# Patient Record
Sex: Male | Born: 1990 | Hispanic: Yes | Marital: Single | Smoking: Former smoker
Health system: Southern US, Community
[De-identification: ages and names within clinical notes are randomized; demographics above are authoritative.]

## PROBLEM LIST (undated history)

## (undated) DIAGNOSIS — J939 Pneumothorax, unspecified: Secondary | ICD-10-CM

## (undated) DIAGNOSIS — J45909 Unspecified asthma, uncomplicated: Secondary | ICD-10-CM

## (undated) HISTORY — DX: Pneumothorax, unspecified: J93.9

---
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
## 2012-02-13 ENCOUNTER — Emergency Department: Payer: Self-pay | Admitting: Emergency Medicine

## 2015-01-06 ENCOUNTER — Encounter: Payer: Self-pay | Admitting: *Deleted

## 2015-01-06 ENCOUNTER — Emergency Department: Payer: Self-pay

## 2015-01-06 ENCOUNTER — Observation Stay
Admission: EM | Admit: 2015-01-06 | Discharge: 2015-01-07 | Disposition: A | Discharge status: Home / Self Care | Payer: Self-pay | Attending: Specialist | Admitting: Specialist

## 2015-01-06 DIAGNOSIS — R062 Wheezing: Secondary | ICD-10-CM

## 2015-01-06 DIAGNOSIS — R079 Chest pain, unspecified: Secondary | ICD-10-CM

## 2015-01-06 DIAGNOSIS — R0789 Other chest pain: Secondary | ICD-10-CM | POA: Insufficient documentation

## 2015-01-06 DIAGNOSIS — J982 Interstitial emphysema: Principal | ICD-10-CM | POA: Diagnosis present

## 2015-01-06 DIAGNOSIS — J45901 Unspecified asthma with (acute) exacerbation: Secondary | ICD-10-CM | POA: Insufficient documentation

## 2015-01-06 DIAGNOSIS — F17210 Nicotine dependence, cigarettes, uncomplicated: Secondary | ICD-10-CM | POA: Insufficient documentation

## 2015-01-06 HISTORY — DX: Unspecified asthma, uncomplicated: J45.909

## 2015-01-06 LAB — COMPREHENSIVE METABOLIC PANEL
ALT: 12 U/L — ABNORMAL LOW (ref 17–63)
AST: 19 U/L (ref 15–41)
Albumin: 4.7 g/dL (ref 3.5–5.0)
Alkaline Phosphatase: 62 U/L (ref 38–126)
Anion gap: 11 (ref 5–15)
BILIRUBIN TOTAL: 0.7 mg/dL (ref 0.3–1.2)
BUN: 11 mg/dL (ref 6–20)
CHLORIDE: 105 mmol/L (ref 101–111)
CO2: 24 mmol/L (ref 22–32)
Calcium: 9.5 mg/dL (ref 8.9–10.3)
Creatinine, Ser: 1.01 mg/dL (ref 0.61–1.24)
GFR calc Af Amer: >60 mL/min (ref >60)
Glucose, Bld: 121 mg/dL — ABNORMAL HIGH (ref 65–99)
POTASSIUM: 3.5 mmol/L (ref 3.5–5.1)
Sodium: 140 mmol/L (ref 135–145)
Total Protein: 7.4 g/dL (ref 6.5–8.1)

## 2015-01-06 LAB — CBC WITH DIFFERENTIAL/PLATELET
Basophils Absolute: 0 10*3/uL (ref 0–0.1)
Basophils Relative: 0 %
EOS PCT: 1 %
Eosinophils Absolute: 0.1 10*3/uL (ref 0–0.7)
HCT: 48.6 % (ref 40.0–52.0)
Hemoglobin: 16.4 g/dL (ref 13.0–18.0)
LYMPHS ABS: 0.8 10*3/uL — AB (ref 1.0–3.6)
Lymphocytes Relative: 5 %
MCH: 31.1 pg (ref 26.0–34.0)
MCHC: 33.8 g/dL (ref 32.0–36.0)
MCV: 92 fL (ref 80.0–100.0)
Monocytes Absolute: 1.1 10*3/uL — ABNORMAL HIGH (ref 0.2–1.0)
Monocytes Relative: 7 %
NEUTROS ABS: 13.2 10*3/uL — AB (ref 1.4–6.5)
NEUTROS PCT: 87 %
PLATELETS: 260 10*3/uL (ref 150–440)
RBC: 5.28 MIL/uL (ref 4.40–5.90)
RDW: 13.3 % (ref 11.5–14.5)
WBC: 15.2 10*3/uL — ABNORMAL HIGH (ref 3.8–10.6)

## 2015-01-06 LAB — TROPONIN I

## 2015-01-06 LAB — POCT RAPID STREP A: STREPTOCOCCUS, GROUP A SCREEN (DIRECT): NEGATIVE

## 2015-01-06 MED ORDER — IPRATROPIUM-ALBUTEROL 0.5-2.5 (3) MG/3ML IN SOLN
3.0000 mL | Freq: Once | RESPIRATORY_TRACT | Status: AC
  Administered 2015-01-06: 3 mL via RESPIRATORY_TRACT

## 2015-01-06 MED ORDER — SODIUM CHLORIDE 0.9 % IV SOLN
Freq: Once | INTRAVENOUS | Status: AC
  Administered 2015-01-06: 22:00:00 via INTRAVENOUS

## 2015-01-06 MED ORDER — IPRATROPIUM-ALBUTEROL 0.5-2.5 (3) MG/3ML IN SOLN
RESPIRATORY_TRACT | Status: AC
  Administered 2015-01-06: 3 mL via RESPIRATORY_TRACT
  Filled 2015-01-06: qty 6

## 2015-01-06 MED ORDER — PREDNISONE 20 MG PO TABS
ORAL_TABLET | ORAL | Status: AC
  Administered 2015-01-06: 60 mg via ORAL
  Filled 2015-01-06: qty 3

## 2015-01-06 MED ORDER — IOHEXOL 300 MG/ML  SOLN
75.0000 mL | Freq: Once | INTRAMUSCULAR | Status: AC | PRN
  Administered 2015-01-06: 75 mL via INTRAVENOUS
  Filled 2015-01-06: qty 75

## 2015-01-06 MED ORDER — PREDNISONE 20 MG PO TABS
60.0000 mg | ORAL_TABLET | Freq: Once | ORAL | Status: AC
  Administered 2015-01-06: 60 mg via ORAL

## 2015-01-06 NOTE — ED Notes (Signed)
Sorethroat, chest pain,,tightness, cold symptoms

## 2015-01-06 NOTE — ED Notes (Signed)
Pt to ED from home c/o runny nose, cough, sore throat, and chest pain starting yesterday.  Pt states pain to generalized chest radiating to throat and lymph nodes and back.  States increased pain with movement and deep breaths.  Pt tender to palpation across chest.  Positive inspiratory wheezes heard to upper and lower left lung and lower right lung.  Pt states hx of asthma as a child "but I have not used my inhaler in 7 years".  Pt states cough with productive green sputum at home.  Pt A&Ox4, speaking in complete and coherent sentences and in NAD at this time.

## 2015-01-06 NOTE — ED Notes (Signed)
Patient transported to X-ray 

## 2015-01-06 NOTE — ED Provider Notes (Signed)
[facility] Emergency Department Provider Note  ____________________________________________  Time seen: Approximately 8:02 PM  I have reviewed the triage vital signs and the nursing notes.   HISTORY  Chief Complaint Cough   HPI Eddie Meadows is a 24 y.o. male presents the ER with family at bedside for the complaint of few days of runny nose, cough and congestion with sore throat. Patient states that yesterday he began to wheeze and cough more. Patient states that when he is coughing and wheezing he has chest discomfort that is described as chest tightness. Patient states the chest tightness is primarily with coughing and wheezing. States feels like he can not take a deep breath but denies shortness of breath. States intermittent mid chest pain that radiates to mid back. States movement does not change pain. Denies fever. Denies fall or trauma.   Reports continues to eat and drink well, but states that it hurts to swallow. Patient states that sore throat present 1 day. Patient states that cough increases at night as well as congestion increases at night. Patient states intermittently coughs up "green sputum".  Patient reports history of similar chest tightness with his asthma as a child but states this is different. Patient states that his asthma has been well controlled for the last few years.  Denies shortness of breath, abdominal pain, fever, neck or back pain, decreased by mouth intake, fall or injury. Denies others recently with sickness around him. Denies chest tightness unless wheezing or coughing.   Past Medical History  Diagnosis Date  . Asthma     Patient Active Problem List   Diagnosis Date Noted  . Pneumomediastinum 01/07/2015    History reviewed. No pertinent past surgical history.  No current outpatient prescriptions on file.  Allergies Penicillins  Family History  Problem Relation Age of Onset  . Diabetes Neg Hx     Social  History History  Substance Use Topics  . Smoking status: Current Every Day Smoker  . Smokeless tobacco: Not on file  . Alcohol Use: Yes    Review of Systems Constitutional: No fever/chills Eyes: No visual changes. ENT: positive for congestion and sore throat. Cardiovascular: Denies chest pain at this time. States intermittent chest tightness with wheezing and cough.  Respiratory: Denies shortness of breath. Positive for cough.  Gastrointestinal: No abdominal pain.  No nausea, no vomiting.  No diarrhea.  No constipation. Genitourinary: Negative for dysuria. Musculoskeletal: Negative for back pain. Skin: Negative for rash. Neurological: Negative for headaches, focal weakness or numbness.  10-point ROS otherwise negative.  ____________________________________________   PHYSICAL EXAM:  VITAL SIGNS: ED Triage Vitals  Enc Vitals Group     BP 01/06/15 1856 107/68 mmHg     Pulse Rate 01/06/15 1856 71     Resp 01/06/15 1856 20     Temp 01/06/15 1856 98.6 F (37 C)     Temp Source 01/06/15 1856 Oral     SpO2 01/06/15 1856 98 %     Weight 01/06/15 1856 160 lb (72.576 kg)     Height 01/06/15 1856 5\' 7"  (1.702 m)     Head Cir --      Peak Flow --      Pain Score 01/06/15 1857 8     Pain Loc --      Pain Edu? --      Excl. in GC? --     Constitutional: Alert and oriented. Well appearing and in no acute distress. Eyes: Conjunctivae are normal. PERRL. EOMI.  Head: Atraumatic. Ears: no erythema, normal TMs.  Nose: mild clear rhinorrhea Mouth/Throat: Mucous membranes are moist. Pharynx mild erythema. No tonsillar swelling. No uvular deviation or shift. Clearing secretions well. Neck: No stridor.  No cervical spine tenderness to palpation. Anterior neck nontender. Hematological/Lymphatic/Immunilogical: No cervical lymphadenopathy. Cardiovascular: Normal rate, regular rhythm. Grossly normal heart sounds.  Good peripheral circulation. Respiratory: Normal respiratory effort.   Inspiratory and expiratory scattered wheezes, worse right lower lobe left upper lobe. Intermittent dry cough. Chest nontender to palpation. No crepitus palpated. No retractions. No ecchymosis.  Gastrointestinal: Soft and nontender. No distention. No abdominal bruits. No CVA tenderness. Normal bowel sounds Musculoskeletal: No lower extremity tenderness nor edema.  No joint effusions. No cervical, thoracic or lumbar tenderness to palpation changes positions quickly without discomfort or distress. Steady gait. Neurologic:  Normal speech and language. No gross focal neurologic deficits are appreciated. Speech is normal. No gait instability. Skin:  Skin is warm, dry and intact. No rash noted. Psychiatric: Mood and affect are normal. Speech and behavior are normal.   PERC negative; Well's score 0.  ____________________________________________   LABS (all labs ordered are listed, but only abnormal results are displayed)  Labs Reviewed  CBC WITH DIFFERENTIAL/PLATELET - Abnormal; Notable for the following:    WBC 15.2 (*)    Neutro Abs 13.2 (*)    Lymphs Abs 0.8 (*)    Monocytes Absolute 1.1 (*)    All other components within normal limits  COMPREHENSIVE METABOLIC PANEL - Abnormal; Notable for the following:    Glucose, Bld 121 (*)    ALT 12 (*)    All other components within normal limits  CULTURE, GROUP A STREP ([facility] ONLY)  TROPONIN I  POCT RAPID STREP A   ____________________________________________  EKG   Date: 01/06/2015  Rate: 100  Rhythm: normal sinus rhythm  QRS Axis: normal  Intervals: normal  ST/T Wave abnormalities: normal  Conduction Disutrbances: none  Narrative Interpretation: normal sinus rhythm   ____________________________________________  RADIOLOGY EXAM: CHEST 2 VIEW  COMPARISON: None.  FINDINGS: Cardiomediastinal silhouette is unremarkable. No acute infiltrate or pleural effusion. No pulmonary edema. There is small amount of subcutaneous air within  soft tissue neck. Lucent mediastinal lines are noted highly suspicious for pneumomediastinum.  IMPRESSION: No acute infiltrate or pulmonary edema. There are lucent mediastinal lines silhouetting the heart border and in upper mediastinum highly suspicious for pneumomediastinum. Clinical correlation is necessary. Further correlation with CT scan of the chest is recommended.   Electronically Signed By: Natasha Mead M.D. On: 01/06/2015 20:58     CT CHEST WITH CONTRAST  TECHNIQUE: Multidetector CT imaging of the chest was performed during intravenous contrast administration.  CONTRAST: 75mL OMNIPAQUE IOHEXOL 300 MG/ML SOLN  COMPARISON: None.  FINDINGS: THORACIC INLET/BODY WALL:  No acute abnormality.  MEDIASTINUM:  Large pneumomediastinum, dissecting into the thoracic inlet and upper chest. There is even extension into the epidural upper thoracic spinal canal. Given the clinical history, this is a presumably related to pulmonary air leak and the patient's history of asthma. No pneumothorax. No fluid collection around the esophagus.  LUNG WINDOWS:  No pneumothorax. Minor atelectasis in the left lower lobe.  UPPER ABDOMEN:  No acute findings.  OSSEOUS:  No acute fracture.  IMPRESSION: Large pneumomediastinum, as discussed above. No pneumothorax.   Electronically Signed By: Marnee Spring M.D. On: 01/07/2015 00:11   I, Renford Dills, personally viewed and evaluated these images as part of my medical decision making. I, Renford Dills, personally discussed these images and results  by phone with the on-call radiologist and used this discussion as part of my medical decision making.   ____________________________________________ _______________________________________   INITIAL IMPRESSION / ASSESSMENT AND PLAN / ED COURSE  Pertinent labs & imaging results that were available during my care of the patient were reviewed by me and considered in my  medical decision making (see chart for details).  Well-appearing patient. No acute distress. Presents to ER for complaints of few days of cough and congestion with increase of cough and wheezing the last day. Presents to ER with his current x-ray wheezing. Will evaluate chest x-ray. Albuterol nebulizing treatment.  2120: X-ray reviewed. Chest x-ray no acute infiltrate or pulmonary edema. The set mediastinal line silhouetting the heart border and upper mediastinum highly suspicious for pneumomediastinum. Discussed with Dr. Ruffin FrederickPop radiologist who recommends CT with contrast for further evaluation of pneumomediastinum.   Discussed with patient and family. Patient verbalized understanding and agreed to plan. Will evaluate labs as well as CT chest with contrast. Patient reports feeling better after nebulizing treatments. Wheezes improved. Patient reports still with some chest tightness.  2245: No acute distress. Resting calmly in bed. Awaiting CT scan. Reports chest tightness now 2/10, wheezes improved. Mild scattered intermittent wheezes. Denies chest pain.   0015: CT chest result reviewed. CT chest revealing large pneumomediastinum, dissecting into the thoracic inlet and upper chest. Extension into the epidural upper thoracic spinal canal. Discussed these findings with Dr. Manson PasseyBrown.  0030: Dr. Manson PasseyBrown discussed CT chest findings with Dr. Juliann PulseLundquist surgery. Dr. Juliann PulseLundquist recommends to discuss with cardiothoracic care for further management and monitoring recommendations.  Discussed CT results with patient and family. Discussed Dr. Juliann PulseLundquist surgeon's recommendations.  Patient verbalized understanding.[facility]  0100: Dr Bowyn Mercier discussed with [facility] cardio thoracic surgeon who recommends monitoring and pt not need to be transferred.   0130: discussed with hospitalist Dr Clint GuyHower, will admit for further monitoring of pneumomediastinum, and management of wheezing and chest  discomfort. Pt verbalized understanding and agreed to plan.   ____________________________________________   FINAL CLINICAL IMPRESSION(S) / ED DIAGNOSES  Final diagnoses:  Pneumomediastinum  Wheezes  Chest pain, unspecified chest pain type     Renford DillsLindsey Miller, NP 01/07/15 0202  Darci Currentandolph N Jaunita Mikels, MD 01/07/15 [phone]

## 2015-01-07 ENCOUNTER — Encounter: Payer: Self-pay | Admitting: Internal Medicine

## 2015-01-07 DIAGNOSIS — J982 Interstitial emphysema: Secondary | ICD-10-CM | POA: Diagnosis present

## 2015-01-07 MED ORDER — METHYLPREDNISOLONE SODIUM SUCC 125 MG IJ SOLR
60.0000 mg | INTRAMUSCULAR | Status: DC
  Administered 2015-01-07: 60 mg via INTRAVENOUS
  Filled 2015-01-07: qty 2

## 2015-01-07 MED ORDER — MORPHINE SULFATE 2 MG/ML IJ SOLN
2.0000 mg | INTRAMUSCULAR | Status: DC | PRN
  Administered 2015-01-07 (×2): 2 mg via INTRAVENOUS
  Filled 2015-01-07 (×2): qty 1

## 2015-01-07 MED ORDER — PNEUMOCOCCAL VAC POLYVALENT 25 MCG/0.5ML IJ INJ: 0.5000 mL | INJECTION | INTRAMUSCULAR | Status: DC

## 2015-01-07 MED ORDER — ACETAMINOPHEN 325 MG PO TABS: 650.0000 mg | ORAL_TABLET | Freq: Four times a day (QID) | ORAL | Status: DC | PRN

## 2015-01-07 MED ORDER — IPRATROPIUM-ALBUTEROL 0.5-2.5 (3) MG/3ML IN SOLN: 3.0000 mL | RESPIRATORY_TRACT | Status: DC | PRN

## 2015-01-07 MED ORDER — OXYCODONE HCL 5 MG PO TABS: 5.0000 mg | ORAL_TABLET | ORAL | Status: DC | PRN

## 2015-01-07 MED ORDER — PREDNISONE 10 MG PO TABS: ORAL_TABLET | ORAL | Status: DC

## 2015-01-07 MED ORDER — ONDANSETRON HCL 4 MG PO TABS: 4.0000 mg | ORAL_TABLET | Freq: Four times a day (QID) | ORAL | Status: DC | PRN

## 2015-01-07 MED ORDER — ONDANSETRON HCL 4 MG/2ML IJ SOLN: 4.0000 mg | Freq: Four times a day (QID) | INTRAMUSCULAR | Status: DC | PRN

## 2015-01-07 MED ORDER — ACETAMINOPHEN 650 MG RE SUPP: 650.0000 mg | Freq: Four times a day (QID) | RECTAL | Status: DC | PRN

## 2015-01-07 MED ORDER — ALBUTEROL SULFATE HFA 108 (90 BASE) MCG/ACT IN AERS: 2.0000 | INHALATION_SPRAY | Freq: Four times a day (QID) | RESPIRATORY_TRACT | Status: DC | PRN

## 2015-01-07 NOTE — Discharge Instructions (Signed)

## 2015-01-07 NOTE — Progress Notes (Signed)
Patient ID: Eddie Meadows, male   DOB: [birth year], 24 y.o.   MRN: [id number]  Chief Complaint  Patient presents with  . Cough    Referred By Dr/ Clint Guy Reason for Referral Pneumodediastinum  HPI Location, Quality, Duration, Severity, Timing, Context, Modifying Factors, Associated Signs and Symptoms.  Eddie Meadows is a 24 y.o. male.  He is in his usual state of excellent health until approximately 48 hours prior to admission when he began to develop some cough associated with a feeling of congestion. This was shortly followed by some stabbing type chest pains as well as pleuritic-type pain with exertion. He's had occasional cough productive of yellow sputum. There's been no blood or weight loss. He's had an excellent appetite and has eaten throughout this hospitalization. He states that he began to feel significant discomfort in his chest and presented to the emergency department for follow-up. While there are chest x-ray and subsequent CT scan confirmed the presence of pneumomediastinum without any other associated pathology. Specifically there is no evidence of an esophageal tear. There is no pneumothorax present. The patient was admitted to the hospital for observation. Since she's been in the hospital he has tolerated his breakfast today without any difficulty. He's had no nausea or vomiting. He denies any recent history of nausea or vomiting. He has not had any fevers. 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 He does have a history of asthma and has had asthma since a child. His states his father's had a similar thing. He has not noticed any significant wheezing episodes.   Past Medical History  Diagnosis Date  . Asthma     History reviewed. No pertinent past surgical history.  Family History  Problem Relation Age of Onset  . Diabetes Neg Hx     Social History History  Substance Use Topics  . Smoking status: Current Every Day Smoker  . Smokeless tobacco:  Not on file  . Alcohol Use: Yes    Allergies  Allergen Reactions  . Penicillins Rash    Current Facility-Administered Medications  Medication Dose Route Frequency Provider Last Rate Last Dose  . acetaminophen (TYLENOL) tablet 650 mg  650 mg Oral Q6H PRN Wyatt Haste, MD       Or  . acetaminophen (TYLENOL) suppository 650 mg  650 mg Rectal Q6H PRN Wyatt Haste, MD      . ipratropium-albuterol (DUONEB) 0.5-2.5 (3) MG/3ML nebulizer solution 3 mL  3 mL Nebulization Q4H PRN Wyatt Haste, MD      . methylPREDNISolone sodium succinate (SOLU-MEDROL) 125 mg/2 mL injection 60 mg  60 mg Intravenous Q24H Wyatt Haste, MD   60 mg at 01/07/15 0355  . morphine 2 MG/ML injection 2 mg  2 mg Intravenous Q4H PRN Wyatt Haste, MD   2 mg at 01/07/15 0959  . ondansetron (ZOFRAN) tablet 4 mg  4 mg Oral Q6H PRN Wyatt Haste, MD       Or  . ondansetron [facility]) injection 4 mg  4 mg Intravenous Q6H PRN Wyatt Haste, MD      . oxyCODONE (Oxy IR/ROXICODONE) immediate release tablet 5 mg  5 mg Oral Q4H PRN Wyatt Haste, MD      . Melene Muller ON 01/08/2015] pneumococcal 23 valent vaccine (PNU-IMMUNE) injection 0.5 mL  0.5 mL Intramuscular Tomorrow-1000 Wyatt Haste, MD          Review of Systems A 10 point review of systems was asked and  was negative except for the following positive findings cough of some yellowish-green mucus. Chest pain. No fever. No nausea or vomiting.  Blood pressure 108/47, pulse 67, temperature 98.3 F (36.8 C), temperature source Oral, resp. rate 16, height 5\' 7"  (1.702 m), weight 71.578 kg (157 lb 12.8 oz), SpO2 98 %.  Physical Exam CONSTITUTIONAL:  Pleasant, well-developed, well-nourished, and in no acute distress. EYES: Pupils equal and reactive to light, Sclera non-icteric EARS, NOSE, MOUTH AND THROAT:  The oropharynx was clear.  Dentition is good repair.  Oral mucosa pink and moist. LYMPH NODES:  Lymph nodes in the neck and axillae were normal RESPIRATORY:  Lungs were clear.   Normal respiratory effort without pathologic use of accessory muscles of respiration CARDIOVASCULAR: Heart was regular without murmurs.  There were no carotid bruits. GI: The abdomen was soft, nontender, and nondistended. There were no palpable masses. There was no hepatosplenomegaly. There were normal bowel sounds in all quadrants. GU:  Rectal deferred.   MUSCULOSKELETAL:  Normal muscle strength and tone.  No clubbing or cyanosis.   SKIN:  There were no pathologic skin lesions.  There were no nodules on palpation. NEUROLOGIC:  Sensation is normal.  Cranial nerves are grossly intact. PSYCH:  Oriented to person, place and time.  Mood and affect are normal.  Data Reviewed I have independently reviewed the patient's chest x-ray and CT scan. I do see the pneumomediastinum. There is no evidence of esophageal perforation or pneumothorax  I have personally reviewed the patient's imaging, laboratory findings and medical records.    Assessment    Spontaneous pneumomediastinum with a benign clinical history.    Plan    I have encouraged the patient to stop all tobacco and marijuana products. I explained to him that inhalation of tobacco smoking and marijuana smoke can injure the lungs. At the present time I see no evidence of any esophageal pathology and was not recommend a barium swallow since it is been more than 48 hours since his symptoms began. Clinically he looks very well and I've instructed the patient to follow-up with his primary caregiver.        @[facility] @ 01/07/2015, 12:00 PM

## 2015-01-07 NOTE — H&P (Signed)
[facility] [place] [facility]   PATIENT NAME: Eddie Meadows    MR#:  [id number]  DATE OF BIRTH:  May 18, 1991   DATE OF ADMISSION:  01/06/2015  PRIMARY CARE PHYSICIAN: No PCP Per Patient   REQUESTING/REFERRING PHYSICIAN: Bayard Malesandolph Brown  CHIEF COMPLAINT:   Chief Complaint  Patient presents with  . Cough    HISTORY OF PRESENT ILLNESS:  Eddie Meadows  is a 24 y.o. male with a known history of asthma mild intermittent presenting with cough and chest pain. 2 day duration cough productive of greenish sputum no fevers or chills no further symptoms of otalgia that time. Now one day duration chest pain retrosternal location radiation to back described only as "pain" intensity 8-9/10 worse with movement also pleuritic in nature. Despite the above actions denies any chest pain Emergency department course: Found to have pneumomediastinum case discussed with general surgery as well as [facility]acic [facility]  PAST MEDICAL HISTORY:   Past Medical History  Diagnosis Date  . Asthma     PAST SURGICAL HISTORY:  History reviewed. No pertinent past surgical history.  SOCIAL HISTORY:   History  Substance Use Topics  . Smoking status: Current Every Day Smoker  . Smokeless tobacco: Not on file  . Alcohol Use: Yes    FAMILY HISTORY:   Family History  Problem Relation Age of Onset  . Diabetes Neg Hx     DRUG ALLERGIES:   Allergies  Allergen Reactions  . Penicillins Rash    REVIEW OF SYSTEMS:  REVIEW OF SYSTEMS:  CONSTITUTIONAL: Denies fevers, chills, fatigue, weakness.  EYES: Denies blurred vision, double vision, or eye pain.  EARS, NOSE, THROAT: Denies tinnitus, ear pain, hearing loss.  RESPIRATORY: Positive cough, denies shortness of breath, wheezing  CARDIOVASCULAR: Positive chest pain, denied palpitations, edema.  GASTROINTESTINAL: Denies nausea, vomiting, diarrhea, abdominal pain.  GENITOURINARY: Denies dysuria, hematuria.   ENDOCRINE: Denies nocturia or thyroid problems. HEMATOLOGIC AND LYMPHATIC: Denies easy bruising or bleeding.  SKIN: Denies rash or lesions.  MUSCULOSKELETAL: Denies pain in neck, back, shoulder, knees, hips, or further arthritic symptoms.  NEUROLOGIC: Denies paralysis, paresthesias.  PSYCHIATRIC: Denies anxiety or depressive symptoms. Otherwise full review of systems performed by me is negative.   MEDICATIONS AT HOME:   Prior to Admission medications   Not on File      VITAL SIGNS:  Blood pressure 135/75, pulse 70, temperature 99 F (37.2 C), temperature source Oral, resp. rate 20, height 5\' 7"  (1.702 m), weight 160 lb (72.576 kg), SpO2 99 %.  PHYSICAL EXAMINATION:  VITAL SIGNS: Filed Vitals:   01/07/15 0141  BP: 135/75  Pulse: 70  Temp: 99 F (37.2 C)  Resp:    GENERAL:23 y.o.male currently in no acute distress.  HEAD: Normocephalic, atraumatic.  EYES: Pupils equal, round, reactive to light. Extraocular muscles intact. No scleral icterus.  MOUTH: Moist mucosal membrane. Dentition intact. No abscess noted.  EAR, NOSE, THROAT: Clear without exudates. No external lesions.  NECK: Supple. No thyromegaly. No nodules. No JVD.  PULMONARY: Dry fine rhonchi most prominent mediastinum with scant wheeze. No use of accessory muscles, Good respiratory effort. good air entry bilaterally CHEST: Nontender to palpation. No palpable crepitus CARDIOVASCULAR: S1 and S2. Regular rate and rhythm. No murmurs, rubs, or gallops. No edema. Pedal pulses 2+ bilaterally.  GASTROINTESTINAL: Soft, nontender, nondistended. No masses. Positive bowel sounds. No hepatosplenomegaly.  MUSCULOSKELETAL: No swelling, clubbing, or edema. Range of motion full in all extremities.  NEUROLOGIC: Cranial  nerves II through XII are intact. No gross focal neurological deficits. Sensation intact. Reflexes intact.  SKIN: No ulceration, lesions, rashes, or cyanosis. Skin warm and dry. Turgor intact.  PSYCHIATRIC: Mood,  affect within normal limits. The patient is awake, alert and oriented x 3. Insight, judgment intact.    LABORATORY PANEL:   CBC  Recent Labs Lab 01/06/15 2159  WBC 15.2*  HGB 16.4  HCT 48.6  PLT 260   ------------------------------------------------------------------------------------------------------------------  Chemistries   Recent Labs Lab 01/06/15 2159  NA 140  K 3.5  CL 105  CO2 24  GLUCOSE 121*  BUN 11  CREATININE 1.01  CALCIUM 9.5  AST 19  ALT 12*  ALKPHOS 62  BILITOT 0.7   ------------------------------------------------------------------------------------------------------------------  Cardiac Enzymes  Recent Labs Lab 01/06/15 2159  TROPONINI <0.03   ------------------------------------------------------------------------------------------------------------------  RADIOLOGY:  Dg Chest 2 View  01/06/2015   CLINICAL DATA:  Cough, runny nose, chest pain starting yesterday  EXAM: CHEST  2 VIEW  COMPARISON:  None.  FINDINGS: Cardiomediastinal silhouette is unremarkable. No acute infiltrate or pleural effusion. No pulmonary edema. There is small amount of subcutaneous air within soft tissue neck. Lucent mediastinal lines are noted highly suspicious for pneumomediastinum.  IMPRESSION: No acute infiltrate or pulmonary edema. There are lucent mediastinal lines silhouetting the heart border and in upper mediastinum highly suspicious for pneumomediastinum. Clinical correlation is necessary. Further correlation with CT scan of the chest is recommended.   Electronically Signed   By: Natasha Mead M.D.   On: 01/06/2015 20:58   Ct Chest W Contrast  01/07/2015   CLINICAL DATA:  Chest discomfort, wheezing, and pneumomediastinum on chest x-ray. History of asthma.  EXAM: CT CHEST WITH CONTRAST  TECHNIQUE: Multidetector CT imaging of the chest was performed during intravenous contrast administration.  CONTRAST:  75mL OMNIPAQUE IOHEXOL 300 MG/ML  SOLN  COMPARISON:  None.   FINDINGS: THORACIC INLET/BODY WALL:  No acute abnormality.  MEDIASTINUM:  Large pneumomediastinum, dissecting into the thoracic inlet and upper chest. There is even extension into the epidural upper thoracic spinal canal. Given the clinical history, this is a presumably related to pulmonary air leak and the patient's history of asthma. No pneumothorax. No fluid collection around the esophagus.  LUNG WINDOWS:  No pneumothorax.  Minor atelectasis in the left lower lobe.  UPPER ABDOMEN:  No acute findings.  OSSEOUS:  No acute fracture.  IMPRESSION: Large pneumomediastinum, as discussed above.  No pneumothorax.   Electronically Signed   By: Marnee Spring M.D.   On: 01/07/2015 00:11    EKG:   Orders placed or performed during the hospital encounter of 01/06/15  . ED EKG  . ED EKG    IMPRESSION AND PLAN:   24 year old Hispanic gentleman history of mild intermittent asthma presenting with cough and chest pain found to have pneumomediastinum.  1. Pneumomediastinum: Predilection given young age as well as history of asthma, avoid incentive spirometry as well as other mechanisms that increased intrathoracic pressure, provide pain control, breathing treatments as required, consults cardiothoracic surgery 2. Venous thromboembolism prophylactic: SCDs for the remote possibility of surgical intervention    All the records are reviewed and case discussed with ED provider. Management plans discussed with the patient, family and they are in agreement.  CODE STATUS: Full  TOTAL TIME TAKING CARE OF THIS PATIENT: 35 minutes.    Hower,  Mardi Mainland.D on 01/07/2015 at 2:00 AM  Between 7am to 6pm - Pager - [phone]  After 6pm: House Pager: - [phone]  [facility]  Office  [phone]  CC: Primary care physician; No PCP Per Patient

## 2015-01-07 NOTE — Discharge Summary (Signed)
[facility] - [place] at [facility]   PATIENT NAME: Eddie Meadows    MR#:  [id number]  DATE OF BIRTH:  05-May-1991  DATE OF ADMISSION:  01/06/2015 ADMITTING PHYSICIAN: Wyatt Haste, MD  DATE OF DISCHARGE:  01/07/2015  PRIMARY CARE PHYSICIAN: No PCP Per Patient    ADMISSION DIAGNOSIS:  Pneumomediastinum [J98.2] Wheezes [R06.2] Chest pain, unspecified chest pain type [R07.9]  DISCHARGE DIAGNOSIS:  Active Problems:   Pneumomediastinum   SECONDARY DIAGNOSIS:   Past Medical History  Diagnosis Date  . Asthma     HOSPITAL COURSE:   24 year old male with past medical history of asthma who presented to the hospital with shortness of breath, chest pain and noted to have a mild asthma exacerbation with pneumomediastinum.  #1 pneumomediastinum-this is likely secondary to patient's asthma. Patient is in no acute distress and has no worsening shortness of breath chest pain and is clinically hemodynamically stable. -Patient was seen by cardiothoracic surgery by Dr. Thelma Barge and he did not think that the patient needed acute intervention presently. He does not think that the patient has a GI source of his pneumomediastinum with an esophageal rupture presently. Patient was advised to get himself a primary care physician and follow up with them as needed.  #2 asthma exacerbation-this was mild in nature. Patient has a mild persistent asthma. -Patient is being discharged on a prednisone taper, albuterol inhaler as needed.  -He is clinically presently not wheezing and has no bronchospasm.  DISCHARGE CONDITIONS:    stable  CONSULTS OBTAINED:  Treatment Team:  Hulda Marin, MD  DRUG ALLERGIES:   Allergies  Allergen Reactions  . Penicillins Rash    DISCHARGE MEDICATIONS:   Current Discharge Medication List    START taking these medications   Details  albuterol (PROVENTIL HFA;VENTOLIN HFA) 108 (90 BASE) MCG/ACT inhaler Inhale 2 puffs into the lungs every 6  (six) hours as needed for wheezing or shortness of breath. Qty: 1 Inhaler, Refills: 2    predniSONE (DELTASONE) 10 MG tablet Label  & dispense according to the schedule below. 5 Pills PO for 1 day then, 4 Pills PO for 1 day, 3 Pills PO for 1 day, 2 Pills PO for 1 day1, 1 Pill PO for 1 days then STOP. Qty: 15 tablet, Refills: 0         DISCHARGE INSTRUCTIONS:   DIET:  Regular diet  DISCHARGE CONDITION:  Stable  ACTIVITY:  Activity as tolerated  OXYGEN:  Home Oxygen: No.   Oxygen Delivery: room air  DISCHARGE LOCATION:  home   If you experience worsening of your admission symptoms, develop shortness of breath, life threatening emergency, suicidal or homicidal thoughts you must seek medical attention immediately by calling 911 or calling your MD immediately  if symptoms less severe.  You Must read complete instructions/literature along with all the possible adverse reactions/side effects for all the Medicines you take and that have been prescribed to you. Take any new Medicines after you have completely understood and accpet all the possible adverse reactions/side effects.   Please note  You were cared for by a hospitalist during your hospital stay. If you have any questions about your discharge medications or the care you received while you were in the hospital after you are discharged, you can call the unit and asked to speak with the hospitalist on call if the hospitalist that took care of you is not available. Once you are discharged, your primary care physician will handle any further  medical issues. Please note that NO REFILLS for any discharge medications will be authorized once you are discharged, as it is imperative that you return to your primary care physician (or establish a relationship with a primary care physician if you do not have one) for your aftercare needs so that they can reassess your need for medications and monitor your lab values.     Today   Patient  denies any worsening shortness of breath, wheezing. Patient does admit to a cough but nonproductive.  VITAL SIGNS:  Blood pressure 108/47, pulse 67, temperature 98.3 F (36.8 C), temperature source Oral, resp. rate 16, height 5\' 7"  (1.702 m), weight 71.578 kg (157 lb 12.8 oz), SpO2 98 %.  I/O:   Intake/Output Summary (Last 24 hours) at 01/07/15 1327 Last data filed at 01/07/15 1147  Gross per 24 hour  Intake    960 ml  Output   1300 ml  Net   -340 ml    PHYSICAL EXAMINATION:  GENERAL:  24 y.o.-year-old patient lying in the bed with no acute distress.  EYES: Pupils equal, round, reactive to light and accommodation. No scleral icterus. Extraocular muscles intact.  HEENT: Head atraumatic, normocephalic. Oropharynx and nasopharynx clear.  NECK:  Supple, no jugular venous distention. No thyroid enlargement, no tenderness.  LUNGS: Normal breath sounds bilaterally, no wheezing, rales,rhonchi. No use of accessory muscles of respiration.  CARDIOVASCULAR: S1, S2 normal. No murmurs, rubs, or gallops.  ABDOMEN: Soft, non-tender, non-distended. Bowel sounds present. No organomegaly or mass.  EXTREMITIES: No pedal edema, cyanosis, or clubbing.  NEUROLOGIC: Cranial nerves II through XII are intact. No focal motor or sensory defecits b/l.  PSYCHIATRIC: The patient is alert and oriented x 3. Good affect.  SKIN: No obvious rash, lesion, or ulcer.   DATA REVIEW:   CBC  Recent Labs Lab 01/06/15 2159  WBC 15.2*  HGB 16.4  HCT 48.6  PLT 260    Chemistries   Recent Labs Lab 01/06/15 2159  NA 140  K 3.5  CL 105  CO2 24  GLUCOSE 121*  BUN 11  CREATININE 1.01  CALCIUM 9.5  AST 19  ALT 12*  ALKPHOS 62  BILITOT 0.7    Cardiac Enzymes  Recent Labs Lab 01/06/15 2159  TROPONINI <0.03    Microbiology Results  Results for orders placed or performed during the hospital encounter of 01/06/15  Culture, group A strep [facility])     Status: None (Preliminary result)   Collection Time:  01/06/15  9:25 PM  Result Value Ref Range Status   Specimen Description THROAT  Final   Special Requests NONE  Final   Culture NO BETA STREPTOCOCCUS ISOLATED IN 12 HOURS  Final   Report Status PENDING  Incomplete    RADIOLOGY:  Dg Chest 2 View  01/06/2015   CLINICAL DATA:  Cough, runny nose, chest pain starting yesterday  EXAM: CHEST  2 VIEW  COMPARISON:  None.  FINDINGS: Cardiomediastinal silhouette is unremarkable. No acute infiltrate or pleural effusion. No pulmonary edema. There is small amount of subcutaneous air within soft tissue neck. Lucent mediastinal lines are noted highly suspicious for pneumomediastinum.  IMPRESSION: No acute infiltrate or pulmonary edema. There are lucent mediastinal lines silhouetting the heart border and in upper mediastinum highly suspicious for pneumomediastinum. Clinical correlation is necessary. Further correlation with CT scan of the chest is recommended.   Electronically Signed   By: Natasha MeadLiviu  Pop M.D.   On: 01/06/2015 20:58   Ct Chest W Contrast  01/07/2015  CLINICAL DATA:  Chest discomfort, wheezing, and pneumomediastinum on chest x-ray. History of asthma.  EXAM: CT CHEST WITH CONTRAST  TECHNIQUE: Multidetector CT imaging of the chest was performed during intravenous contrast administration.  CONTRAST:  75mL OMNIPAQUE IOHEXOL 300 MG/ML  SOLN  COMPARISON:  None.  FINDINGS: THORACIC INLET/BODY WALL:  No acute abnormality.  MEDIASTINUM:  Large pneumomediastinum, dissecting into the thoracic inlet and upper chest. There is even extension into the epidural upper thoracic spinal canal. Given the clinical history, this is a presumably related to pulmonary air leak and the patient's history of asthma. No pneumothorax. No fluid collection around the esophagus.  LUNG WINDOWS:  No pneumothorax.  Minor atelectasis in the left lower lobe.  UPPER ABDOMEN:  No acute findings.  OSSEOUS:  No acute fracture.  IMPRESSION: Large pneumomediastinum, as discussed above.  No pneumothorax.    Electronically Signed   By: Marnee Spring M.D.   On: 01/07/2015 00:11      Management plans discussed with the patient, family and they are in agreement.  CODE STATUS:     Code Status Orders        Start     Ordered   01/07/15 0139  Full code   Continuous     01/07/15 0138      TOTAL TIME TAKING CARE OF THIS PATIENT: 40 minutes.    Houston Siren M.D on 01/07/2015 at 1:27 PM  Between 7am to 6pm - Pager - [phone]  After 6pm go to [email or web address] - password [email or web address] [facility]  [facility]  Office  [phone]  CC: Primary care physician; No PCP Per Patient

## 2015-01-09 LAB — CULTURE, GROUP A STREP (THRC)

## 2015-01-28 ENCOUNTER — Encounter: Payer: Self-pay | Admitting: Emergency Medicine

## 2015-01-28 ENCOUNTER — Emergency Department
Admission: EM | Admit: 2015-01-28 | Discharge: 2015-01-28 | Disposition: A | Discharge status: Home / Self Care | Payer: Self-pay | Attending: Student | Admitting: Student

## 2015-01-28 ENCOUNTER — Emergency Department: Payer: Self-pay

## 2015-01-28 DIAGNOSIS — Y9389 Activity, other specified: Secondary | ICD-10-CM | POA: Insufficient documentation

## 2015-01-28 DIAGNOSIS — Y998 Other external cause status: Secondary | ICD-10-CM | POA: Insufficient documentation

## 2015-01-28 DIAGNOSIS — S199XXA Unspecified injury of neck, initial encounter: Secondary | ICD-10-CM | POA: Insufficient documentation

## 2015-01-28 DIAGNOSIS — S0990XA Unspecified injury of head, initial encounter: Secondary | ICD-10-CM | POA: Insufficient documentation

## 2015-01-28 DIAGNOSIS — Y92410 Unspecified street and highway as the place of occurrence of the external cause: Secondary | ICD-10-CM | POA: Insufficient documentation

## 2015-01-28 DIAGNOSIS — T148 Other injury of unspecified body region: Secondary | ICD-10-CM | POA: Insufficient documentation

## 2015-01-28 DIAGNOSIS — S299XXA Unspecified injury of thorax, initial encounter: Secondary | ICD-10-CM | POA: Insufficient documentation

## 2015-01-28 DIAGNOSIS — Z880 Allergy status to penicillin: Secondary | ICD-10-CM | POA: Insufficient documentation

## 2015-01-28 DIAGNOSIS — T148XXA Other injury of unspecified body region, initial encounter: Secondary | ICD-10-CM

## 2015-01-28 DIAGNOSIS — Z720 Tobacco use: Secondary | ICD-10-CM | POA: Insufficient documentation

## 2015-01-28 DIAGNOSIS — Z7952 Long term (current) use of systemic steroids: Secondary | ICD-10-CM | POA: Insufficient documentation

## 2015-01-28 MED ORDER — ONDANSETRON 4 MG PO TBDP
4.0000 mg | ORAL_TABLET | Freq: Once | ORAL | Status: AC
  Administered 2015-01-28: 4 mg via ORAL
  Filled 2015-01-28: qty 1

## 2015-01-28 MED ORDER — MORPHINE SULFATE 4 MG/ML IJ SOLN
4.0000 mg | Freq: Once | INTRAMUSCULAR | Status: AC
  Administered 2015-01-28: 4 mg via INTRAMUSCULAR
  Filled 2015-01-28: qty 1

## 2015-01-28 MED ORDER — IBUPROFEN 600 MG PO TABS: 600.0000 mg | ORAL_TABLET | Freq: Four times a day (QID) | ORAL | Status: DC | PRN
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
## 2018-03-01 ENCOUNTER — Emergency Department
Admission: EM | Admit: 2018-03-01 | Discharge: 2018-03-01 | Disposition: A | Discharge status: Home / Self Care | Payer: Self-pay | Attending: Emergency Medicine | Admitting: Emergency Medicine

## 2018-03-01 DIAGNOSIS — S0502XA Injury of conjunctiva and corneal abrasion without foreign body, left eye, initial encounter: Secondary | ICD-10-CM | POA: Insufficient documentation

## 2018-03-01 DIAGNOSIS — W228XXA Striking against or struck by other objects, initial encounter: Secondary | ICD-10-CM | POA: Insufficient documentation

## 2018-03-01 DIAGNOSIS — Y93H2 Activity, gardening and landscaping: Secondary | ICD-10-CM | POA: Insufficient documentation

## 2018-03-01 DIAGNOSIS — J45909 Unspecified asthma, uncomplicated: Secondary | ICD-10-CM | POA: Insufficient documentation

## 2018-03-01 DIAGNOSIS — Y999 Unspecified external cause status: Secondary | ICD-10-CM | POA: Insufficient documentation

## 2018-03-01 DIAGNOSIS — Y92007 Garden or yard of unspecified non-institutional (private) residence as the place of occurrence of the external cause: Secondary | ICD-10-CM | POA: Insufficient documentation

## 2018-03-01 DIAGNOSIS — Z87891 Personal history of nicotine dependence: Secondary | ICD-10-CM | POA: Insufficient documentation

## 2018-03-01 DIAGNOSIS — Z79899 Other long term (current) drug therapy: Secondary | ICD-10-CM | POA: Insufficient documentation

## 2018-03-01 MED ORDER — KETOROLAC TROMETHAMINE 0.5 % OP SOLN: 1.0000 [drp] | Freq: Four times a day (QID) | OPHTHALMIC | 0 refills | Status: DC

## 2018-03-01 MED ORDER — FLUORESCEIN SODIUM 1 MG OP STRP
1.0000 | ORAL_STRIP | Freq: Once | OPHTHALMIC | Status: AC
  Administered 2018-03-01: 1 via OPHTHALMIC
  Filled 2018-03-01: qty 1

## 2018-03-01 MED ORDER — POLYMYXIN B-TRIMETHOPRIM 10000-0.1 UNIT/ML-% OP SOLN: 2.0000 [drp] | Freq: Four times a day (QID) | OPHTHALMIC | 0 refills | Status: DC

## 2018-03-01 MED ORDER — TETRACAINE HCL 0.5 % OP SOLN
1.0000 [drp] | Freq: Once | OPHTHALMIC | Status: AC
  Administered 2018-03-01: 2 [drp] via OPHTHALMIC
  Filled 2018-03-01: qty 4

## 2018-03-01 NOTE — ED Triage Notes (Signed)
Patient reports he was cutting the grass yesterday, a rock flew up and hit his left eye.

## 2018-03-01 NOTE — ED Provider Notes (Signed)
[facility] Emergency Department Provider Note  ____________________________________________  Time seen: Approximately 8:04 PM  I have reviewed the triage vital signs and the nursing notes.   HISTORY  Chief Complaint Eye Pain    [facility] [address] [facility] Eddie Meadows is a 27 y.o. male who presents emergency department complaining of left eye pain/irritation/foreign body sensation.  Patient reports that he was mowing yesterday, a rock flew up and struck him in the left eye.  Patient does not wear glasses or contacts.  He reports that the eye will occasionally appear blurry but no significant visual changes.  Patient denies any other injury to the face.  No other complaints at this time.    Past Medical History:  Diagnosis Date  . Asthma     Patient Active Problem List   Diagnosis Date Noted  . Pneumomediastinum (HCC) 01/07/2015    History reviewed. No pertinent surgical history.  Prior to Admission medications   Medication Sig Start Date End Date Taking? Authorizing Provider  albuterol (PROVENTIL HFA;VENTOLIN HFA) 108 (90 BASE) MCG/ACT inhaler Inhale 2 puffs into the lungs every 6 (six) hours as needed for wheezing or shortness of breath. 01/07/15   Houston Siren, MD  ibuprofen (ADVIL,MOTRIN) 600 MG tablet Take 1 tablet (600 mg total) by mouth every 6 (six) hours as needed for moderate pain. 01/28/15   Gayla Doss, MD  ketorolac (ACULAR) 0.5 % ophthalmic solution Place 1 drop into the left eye 4 (four) times daily. 03/01/18   Austyn Perriello, Delorise Royals, PA-C  predniSONE (DELTASONE) 10 MG tablet Label  & dispense according to the schedule below. 5 Pills PO for 1 day then, 4 Pills PO for 1 day, 3 Pills PO for 1 day, 2 Pills PO for 1 day1, 1 Pill PO for 1 days then STOP. 01/07/15   Sainani, Rolly Pancake, MD  trimethoprim-polymyxin b (POLYTRIM) ophthalmic solution Place 2 drops into the left eye every 6 (six) hours. 03/01/18   Axiel Fjeld, Delorise Royals, PA-C     Allergies Penicillins  Family History  Problem Relation Age of Onset  . Diabetes Neg Hx     Social History Social History   Tobacco Use  . Smoking status: Former Games developer  . Smokeless tobacco: Never Used  Substance Use Topics  . Alcohol use: Not Currently  . Drug use: Not on file     Review of Systems  Constitutional: No fever/chills Eyes: No visual changes. No discharge.  Thigh foreign body sensation. ENT: No upper respiratory complaints. Cardiovascular: no chest pain. Respiratory: no cough. No SOB. Gastrointestinal: No abdominal pain.  No nausea, no vomiting.  No diarrhea.  No constipation. Musculoskeletal: Negative for musculoskeletal pain. Skin: Negative for rash, abrasions, lacerations, ecchymosis. Neurological: Negative for headaches, focal weakness or numbness. 10-point ROS otherwise negative.  ____________________________________________   PHYSICAL EXAM:  VITAL SIGNS: ED Triage Vitals  Enc Vitals Group     BP 03/01/18 1922 140/76     Pulse Rate 03/01/18 1922 73     Resp 03/01/18 1922 17     Temp 03/01/18 1922 98.2 F (36.8 C)     Temp Source 03/01/18 1922 Oral     SpO2 03/01/18 1922 100 %     Weight 03/01/18 1924 160 lb (72.6 kg)     Height 03/01/18 1924 5\' 7"  (1.702 m)     Head Circumference --      Peak Flow --      Pain Score 03/01/18 1931 7     Pain  Loc --      Pain Edu? --      Excl. in GC? --      Constitutional: Alert and oriented. Well appearing and in no acute distress. Eyes: Conjunctiva on right is unremarkable.  Conjunctiva on left is mildly erythematous.Marland Kitchen PERRL. EOMI. visualization on funduscopic exam reveals no visible foreign body.  Red reflex, vasculature, optic disc appreciated on the left and right.  No hyphema.  Eyes anesthetized using tetracaine, fluorescein staining applied with 2 areas of uptake, one in the 7 o'clock position, one over the iris.  No visible foreign body. Head: Atraumatic. ENT:      Ears:       Nose: No  congestion/rhinnorhea.      Mouth/Throat: Mucous membranes are moist.  Neck: No stridor.    Cardiovascular: Normal rate, regular rhythm. Normal S1 and S2.  Good peripheral circulation. Respiratory: Normal respiratory effort without tachypnea or retractions. Lungs CTAB. Good air entry to the bases with no decreased or absent breath sounds. Musculoskeletal: Full range of motion to all extremities. No gross deformities appreciated. Neurologic:  Normal speech and language. No gross focal neurologic deficits are appreciated.  Skin:  Skin is warm, dry and intact. No rash noted. Psychiatric: Mood and affect are normal. Speech and behavior are normal. Patient exhibits appropriate insight and judgement.   ____________________________________________   LABS (all labs ordered are listed, but only abnormal results are displayed)  Labs Reviewed - No data to display ____________________________________________  EKG   ____________________________________________  RADIOLOGY   No results found.  ____________________________________________    PROCEDURES  Procedure(s) performed:    Procedures    Medications  tetracaine (PONTOCAINE) 0.5 % ophthalmic solution 1-2 drop (has no administration in time range)  fluorescein ophthalmic strip 1 strip (has no administration in time range)     ____________________________________________   INITIAL IMPRESSION / ASSESSMENT AND PLAN / ED COURSE  Pertinent labs & imaging results that were available during my care of the patient were reviewed by me and considered in my medical decision making (see chart for details).  Review of the  [place] was performed in accordance of the [place] prior to dispensing any controlled drugs.      Patient's diagnosis is consistent with corneal abrasions.  Patient presents the emergency department complaining of foreign body sensation to the left eye.  Findings are consistent with corneal abrasion with no visible  foreign body.  Exam is otherwise reassuring.  No indication for further work-up.. Patient will be discharged home with prescriptions for Acular and Polytrim. Patient is to follow up with ophthalmology as needed or otherwise directed. Patient is given ED precautions to return to the ED for any worsening or new symptoms.     ____________________________________________  FINAL CLINICAL IMPRESSION(S) / ED DIAGNOSES  Final diagnoses:  Abrasion of left cornea, initial encounter      NEW MEDICATIONS STARTED DURING THIS VISIT:  ED Discharge Orders         Ordered    trimethoprim-polymyxin b (POLYTRIM) ophthalmic solution  Every 6 hours     03/01/18 2020    ketorolac (ACULAR) 0.5 % ophthalmic solution  4 times daily     03/01/18 2020              This chart was dictated using voice recognition software/Dragon. Despite best efforts to proofread, errors can occur which can change the meaning. Any change was purely unintentional.    Racheal Patches, PA-C 03/01/18 2021    York Cerise,  Kandee Keenory, MD 03/02/18 [id number]

## 2019-04-26 ENCOUNTER — Other Ambulatory Visit: Payer: Self-pay

## 2019-04-26 ENCOUNTER — Emergency Department
Admission: EM | Admit: 2019-04-26 | Discharge: 2019-04-26 | Disposition: A | Discharge status: Home / Self Care | Payer: Self-pay | Attending: Emergency Medicine | Admitting: Emergency Medicine

## 2019-04-26 ENCOUNTER — Emergency Department: Payer: Self-pay

## 2019-04-26 ENCOUNTER — Encounter: Payer: Self-pay | Admitting: Emergency Medicine

## 2019-04-26 DIAGNOSIS — J45909 Unspecified asthma, uncomplicated: Secondary | ICD-10-CM | POA: Insufficient documentation

## 2019-04-26 DIAGNOSIS — Z20822 Contact with and (suspected) exposure to covid-19: Secondary | ICD-10-CM

## 2019-04-26 DIAGNOSIS — R509 Fever, unspecified: Secondary | ICD-10-CM

## 2019-04-26 DIAGNOSIS — Z87891 Personal history of nicotine dependence: Secondary | ICD-10-CM | POA: Insufficient documentation

## 2019-04-26 DIAGNOSIS — R112 Nausea with vomiting, unspecified: Secondary | ICD-10-CM

## 2019-04-26 DIAGNOSIS — K2900 Acute gastritis without bleeding: Secondary | ICD-10-CM

## 2019-04-26 DIAGNOSIS — Z79899 Other long term (current) drug therapy: Secondary | ICD-10-CM | POA: Insufficient documentation

## 2019-04-26 DIAGNOSIS — Z20828 Contact with and (suspected) exposure to other viral communicable diseases: Secondary | ICD-10-CM | POA: Insufficient documentation

## 2019-04-26 LAB — COMPREHENSIVE METABOLIC PANEL
ALT: 19 U/L (ref 0–44)
AST: 28 U/L (ref 15–41)
Albumin: 5.1 g/dL — ABNORMAL HIGH (ref 3.5–5.0)
Alkaline Phosphatase: 61 U/L (ref 38–126)
Anion gap: 12 (ref 5–15)
BUN: 22 mg/dL — ABNORMAL HIGH (ref 6–20)
CO2: 25 mmol/L (ref 22–32)
Calcium: 9.4 mg/dL (ref 8.9–10.3)
Chloride: 101 mmol/L (ref 98–111)
Creatinine, Ser: 0.86 mg/dL (ref 0.61–1.24)
GFR calc Af Amer: >60 mL/min (ref >60)
GFR calc non Af Amer: >60 mL/min (ref >60)
Glucose, Bld: 120 mg/dL — ABNORMAL HIGH (ref 70–99)
Potassium: 3.4 mmol/L — ABNORMAL LOW (ref 3.5–5.1)
Sodium: 138 mmol/L (ref 135–145)
Total Bilirubin: 1 mg/dL (ref 0.3–1.2)
Total Protein: 7.9 g/dL (ref 6.5–8.1)

## 2019-04-26 LAB — LIPASE, BLOOD: Lipase: 28 U/L (ref 11–51)

## 2019-04-26 LAB — CBC WITH DIFFERENTIAL/PLATELET
Abs Immature Granulocytes: 0.02 10*3/uL (ref 0.00–0.07)
Basophils Absolute: 0.1 10*3/uL (ref 0.0–0.1)
Basophils Relative: 1 %
Eosinophils Absolute: 0.1 10*3/uL (ref 0.0–0.5)
Eosinophils Relative: 2 %
HCT: 47 % (ref 39.0–52.0)
Hemoglobin: 16.9 g/dL (ref 13.0–17.0)
Immature Granulocytes: 0 %
Lymphocytes Relative: 44 %
Lymphs Abs: 3.2 10*3/uL (ref 0.7–4.0)
MCH: 31.8 pg (ref 26.0–34.0)
MCHC: 36 g/dL (ref 30.0–36.0)
MCV: 88.3 fL (ref 80.0–100.0)
Monocytes Absolute: 1.2 10*3/uL — ABNORMAL HIGH (ref 0.1–1.0)
Monocytes Relative: 16 %
Neutro Abs: 2.7 10*3/uL (ref 1.7–7.7)
Neutrophils Relative %: 37 %
Platelets: 314 10*3/uL (ref 150–400)
RBC: 5.32 MIL/uL (ref 4.22–5.81)
RDW: 11.7 % (ref 11.5–15.5)
WBC: 7.3 10*3/uL (ref 4.0–10.5)
nRBC: 0 % (ref 0.0–0.2)

## 2019-04-26 LAB — URINALYSIS, COMPLETE (UACMP) WITH MICROSCOPIC
Bacteria, UA: NONE SEEN
Bilirubin Urine: NEGATIVE
Glucose, UA: NEGATIVE mg/dL
Hgb urine dipstick: NEGATIVE
Ketones, ur: 5 mg/dL — AB
Leukocytes,Ua: NEGATIVE
Nitrite: NEGATIVE
Protein, ur: NEGATIVE mg/dL
Specific Gravity, Urine: 1.021 (ref 1.005–1.030)
pH: 6 (ref 5.0–8.0)

## 2019-04-26 LAB — SARS CORONAVIRUS 2 (TAT 6-24 HRS): SARS Coronavirus 2: NEGATIVE

## 2019-04-26 MED ORDER — ONDANSETRON HCL 4 MG/2ML IJ SOLN
4.0000 mg | Freq: Once | INTRAMUSCULAR | Status: AC
  Administered 2019-04-26: 4 mg via INTRAVENOUS
  Filled 2019-04-26: qty 2

## 2019-04-26 MED ORDER — SODIUM CHLORIDE 0.9 % IV BOLUS
1000.0000 mL | Freq: Once | INTRAVENOUS | Status: AC
  Administered 2019-04-26: 1000 mL via INTRAVENOUS

## 2019-04-26 MED ORDER — FAMOTIDINE IN NACL 20-0.9 MG/50ML-% IV SOLN
20.0000 mg | Freq: Once | INTRAVENOUS | Status: AC
  Administered 2019-04-26: 20 mg via INTRAVENOUS
  Filled 2019-04-26: qty 50

## 2019-04-26 MED ORDER — FAMOTIDINE 20 MG PO TABS: 20.0000 mg | ORAL_TABLET | Freq: Two times a day (BID) | ORAL | 0 refills | Status: DC

## 2019-04-26 MED ORDER — ONDANSETRON 4 MG PO TBDP: 4.0000 mg | ORAL_TABLET | Freq: Three times a day (TID) | ORAL | 0 refills | Status: DC | PRN

## 2019-04-26 NOTE — ED Triage Notes (Signed)
Patient ambulatory to triage with steady gait, without difficulty or distress noted, mask in place; pt reports his father recently dx with COVID and is concerned that he has same; reports fever and vomiting since last night

## 2019-04-26 NOTE — ED Provider Notes (Signed)
[facility] Emergency Department Provider Note   ____________________________________________   First MD Initiated Contact with Patient 04/26/19 [id number]     (approximate)  I have reviewed the triage vital signs and the nursing notes.   HISTORY  Chief Complaint Emesis and Fever    [facility] is a 28 y.o. male who presents to the ED from home with a chief complaint of Covid concerns, fever and vomiting.  Patient lives with his father who was recently diagnosed with COVID-19.  Reports subjective fever, epigastric burning type pain after eating spicy foods and vomiting since last evening.  History of asthma as a child, never intubated.  Denies chest pain, shortness of breath, dysuria or diarrhea.       Past Medical History:  Diagnosis Date  . Asthma     Patient Active Problem List   Diagnosis Date Noted  . Pneumomediastinum (HCC) 01/07/2015    History reviewed. No pertinent surgical history.  Prior to Admission medications   Medication Sig Start Date End Date Taking? Authorizing Provider  albuterol (PROVENTIL HFA;VENTOLIN HFA) 108 (90 BASE) MCG/ACT inhaler Inhale 2 puffs into the lungs every 6 (six) hours as needed for wheezing or shortness of breath. 01/07/15   Houston SirenSainani, Vivek J, MD  ibuprofen (ADVIL,MOTRIN) 600 MG tablet Take 1 tablet (600 mg total) by mouth every 6 (six) hours as needed for moderate pain. 01/28/15   Gayla DossGayle, Eryka A, MD  ketorolac (ACULAR) 0.5 % ophthalmic solution Place 1 drop into the left eye 4 (four) times daily. 03/01/18   Cuthriell, Delorise RoyalsJonathan D, PA-C  predniSONE (DELTASONE) 10 MG tablet Label  & dispense according to the schedule below. 5 Pills PO for 1 day then, 4 Pills PO for 1 day, 3 Pills PO for 1 day, 2 Pills PO for 1 day1, 1 Pill PO for 1 days then STOP. 01/07/15   Sainani, Rolly PancakeVivek J, MD  trimethoprim-polymyxin b (POLYTRIM) ophthalmic solution Place 2 drops into the left eye every 6 (six) hours. 03/01/18   Cuthriell,  Delorise RoyalsJonathan D, PA-C    Allergies Penicillins  Family History  Problem Relation Age of Onset  . Diabetes Neg Hx     Social History Social History   Tobacco Use  . Smoking status: Former Games developermoker  . Smokeless tobacco: Never Used  Substance Use Topics  . Alcohol use: Not Currently  . Drug use: Not on file    Review of Systems  Constitutional: Positive for subjective fever Eyes: No visual changes. ENT: No sore throat. Cardiovascular: Denies chest pain. Respiratory: Denies shortness of breath. Gastrointestinal: Positive for epigastric abdominal burning, nausea and vomiting.  No diarrhea.  No constipation. Genitourinary: Negative for dysuria. Musculoskeletal: Negative for back pain. Skin: Negative for rash. Neurological: Negative for headaches, focal weakness or numbness.   ____________________________________________   PHYSICAL EXAM:  VITAL SIGNS: ED Triage Vitals [04/26/19 0500]  Enc Vitals Group     BP 127/80     Pulse Rate 68     Resp 18     Temp (!) 97.5 F (36.4 C)     Temp Source Oral     SpO2 99 %     Weight      Height      Head Circumference      Peak Flow      Pain Score      Pain Loc      Pain Edu?      Excl. in GC?     Constitutional:  Alert and oriented. Well appearing and in no acute distress. Eyes: Conjunctivae are normal. PERRL. EOMI. Head: Atraumatic. Nose: No congestion/rhinnorhea. Mouth/Throat: Mucous membranes are moist.  Oropharynx non-erythematous. Neck: No stridor.   Cardiovascular: Normal rate, regular rhythm. Grossly normal heart sounds.  Good peripheral circulation. Respiratory: Normal respiratory effort.  No retractions. Lungs CTAB. Gastrointestinal: Soft and minimally tender to palpation epigastrium without rebound or guarding. No distention. No abdominal bruits. No CVA tenderness. Musculoskeletal: No lower extremity tenderness nor edema.  No joint effusions. Neurologic:  Normal speech and language. No gross focal neurologic  deficits are appreciated. No gait instability. Skin:  Skin is warm, dry and intact. No rash noted. Psychiatric: Mood and affect are normal. Speech and behavior are normal.  ____________________________________________   LABS (all labs ordered are listed, but only abnormal results are displayed)  Labs Reviewed  CBC WITH DIFFERENTIAL/PLATELET - Abnormal; Notable for the following components:      Result Value   Monocytes Absolute 1.2 (*)    All other components within normal limits  COMPREHENSIVE METABOLIC PANEL - Abnormal; Notable for the following components:   Potassium 3.4 (*)    Glucose, Bld 120 (*)    BUN 22 (*)    Albumin 5.1 (*)    All other components within normal limits  URINALYSIS, COMPLETE (UACMP) WITH MICROSCOPIC - Abnormal; Notable for the following components:   Color, Urine YELLOW (*)    APPearance CLEAR (*)    Ketones, ur 5 (*)    All other components within normal limits  SARS CORONAVIRUS 2 (TAT 6-24 HRS)  LIPASE, BLOOD   ____________________________________________  EKG  None ____________________________________________  RADIOLOGY  ED MD interpretation: No acute cardiopulmonary process  Official radiology report(s): Dg Chest 1 View  Result Date: 04/26/2019 CLINICAL DATA:  Fever.  COVID exposure EXAM: CHEST  1 VIEW COMPARISON:  01/07/2015 FINDINGS: Normal heart size and mediastinal contours. No acute infiltrate or edema. No effusion or pneumothorax. No acute osseous findings. IMPRESSION: Negative chest. Electronically Signed   By: Monte Fantasia M.D.   On: 04/26/2019 06:37    ____________________________________________   PROCEDURES  Procedure(s) performed (including Critical Care):  Procedures   ____________________________________________   INITIAL IMPRESSION / ASSESSMENT AND PLAN / ED COURSE  As part of my medical decision making, I reviewed the following data within the [facility] notes reviewed and  incorporated, Labs reviewed, Radiograph reviewed and Notes from prior ED visits     Bushnell was evaluated in Emergency Department on 04/26/2019 for the symptoms described in the history of present illness. He was evaluated in the context of the global COVID-19 pandemic, which necessitated consideration that the patient might be at risk for infection with the SARS-CoV-2 virus that causes COVID-19. Institutional protocols and algorithms that pertain to the evaluation of patients at risk for COVID-19 are in a state of rapid change based on information released by regulatory bodies including the CDC and federal and state organizations. These policies and algorithms were followed during the patient's care in the ED.    28 year old male who presents with subjective fever, epigastric burning, nausea and vomiting; close Covid contact.  Differential diagnosis includes but is not limited to viral process such as COVID-19, gastritis, PUD, CAP, etc.  Laboratory results including LFTs and lipase unremarkable.  Chest x-ray does not demonstrate signs of pneumonia.  He is well-appearing with moist mucous membranes.  Will initiate IV fluid resuscitation, Pepcid and Zofran.  Chest x-ray is negative for  pneumonia.  Anticipate discharge home after completion of IV fluids.  Will discharge on Pepcid, Zofran as needed.  Send out Covid testing pending.  Strict return precautions given.  Patient verbalizes understanding and agrees with plan of care.      ____________________________________________   FINAL CLINICAL IMPRESSION(S) / ED DIAGNOSES  Final diagnoses:  Close exposure to COVID-19 virus  Non-intractable vomiting with nausea, unspecified vomiting type  Acute gastritis without hemorrhage, unspecified gastritis type     ED Discharge Orders    None       Note:  This document was prepared using Dragon voice recognition software and may include unintentional dictation errors.   Irean Hong, MD 04/26/19 [phone]

## 2019-04-26 NOTE — Discharge Instructions (Signed)
1.  Start Pepcid 20 mg daily. 2.  You may take Zofran as needed for nausea/vomiting. 3.  You will be called with any positive COVID-19 test results.  Until then, please isolate yourself from coworkers and family members. 4.  Return to the ER for worsening symptoms, persistent vomiting, difficulty breathing or other concerns.

## 2019-05-06 ENCOUNTER — Ambulatory Visit: Payer: Self-pay

## 2019-05-16 ENCOUNTER — Other Ambulatory Visit: Payer: Self-pay

## 2019-05-16 ENCOUNTER — Ambulatory Visit (LOCAL_COMMUNITY_HEALTH_CENTER): Payer: Self-pay

## 2019-05-16 DIAGNOSIS — Z23 Encounter for immunization: Secondary | ICD-10-CM

## 2019-05-16 NOTE — Progress Notes (Signed)
Presents for influenza and Tdap (last received in 2008) vaccine. Per verbal order of Dr. Ernestina Patches, may administer Tdap 0.5 mL x1 today. Client to return for TB skin test and will notify staff of additional vaccines needed for immigration at that time. Rich Number, RN

## 2019-05-20 ENCOUNTER — Other Ambulatory Visit: Payer: Self-pay

## 2020-03-16 ENCOUNTER — Other Ambulatory Visit: Payer: Self-pay

## 2020-03-16 DIAGNOSIS — Z20822 Contact with and (suspected) exposure to covid-19: Secondary | ICD-10-CM

## 2020-03-17 LAB — NOVEL CORONAVIRUS, NAA: SARS-CoV-2, NAA: NOT DETECTED

## 2020-03-17 LAB — SARS-COV-2, NAA 2 DAY TAT

## 2020-05-11 ENCOUNTER — Emergency Department
Admission: EM | Admit: 2020-05-11 | Discharge: 2020-05-11 | Disposition: A | Discharge status: Home / Self Care | Payer: Self-pay | Attending: Emergency Medicine | Admitting: Emergency Medicine

## 2020-05-11 ENCOUNTER — Emergency Department: Payer: Self-pay

## 2020-05-11 ENCOUNTER — Other Ambulatory Visit: Payer: Self-pay

## 2020-05-11 DIAGNOSIS — Y9389 Activity, other specified: Secondary | ICD-10-CM | POA: Insufficient documentation

## 2020-05-11 DIAGNOSIS — Y92410 Unspecified street and highway as the place of occurrence of the external cause: Secondary | ICD-10-CM | POA: Insufficient documentation

## 2020-05-11 DIAGNOSIS — J45909 Unspecified asthma, uncomplicated: Secondary | ICD-10-CM | POA: Insufficient documentation

## 2020-05-11 DIAGNOSIS — Z87891 Personal history of nicotine dependence: Secondary | ICD-10-CM | POA: Insufficient documentation

## 2020-05-11 DIAGNOSIS — M5432 Sciatica, left side: Secondary | ICD-10-CM | POA: Insufficient documentation

## 2020-05-11 MED ORDER — HYDROCODONE-ACETAMINOPHEN 5-325 MG PO TABS: 1.0000 | ORAL_TABLET | Freq: Four times a day (QID) | ORAL | 0 refills | Status: DC | PRN

## 2020-05-11 MED ORDER — KETOROLAC TROMETHAMINE 60 MG/2ML IM SOLN
30.0000 mg | Freq: Once | INTRAMUSCULAR | Status: AC
  Administered 2020-05-11: 30 mg via INTRAMUSCULAR
  Filled 2020-05-11: qty 2

## 2020-05-11 MED ORDER — METHYLPREDNISOLONE 4 MG PO TBPK: ORAL_TABLET | ORAL | 0 refills | Status: DC

## 2020-05-11 MED ORDER — ORPHENADRINE CITRATE 30 MG/ML IJ SOLN
60.0000 mg | Freq: Two times a day (BID) | INTRAMUSCULAR | Status: DC
  Administered 2020-05-11: 60 mg via INTRAMUSCULAR
  Filled 2020-05-11: qty 2

## 2020-05-11 MED ORDER — TIZANIDINE HCL 4 MG PO TABS: 4.0000 mg | ORAL_TABLET | Freq: Three times a day (TID) | ORAL | 0 refills | Status: DC

## 2020-05-11 MED ORDER — HYDROCODONE-ACETAMINOPHEN 5-325 MG PO TABS: 1.0000 | ORAL_TABLET | Freq: Four times a day (QID) | ORAL | 0 refills | Status: AC | PRN
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
## 2020-05-11 NOTE — ED Notes (Signed)
Pt alert and oriented X 4, stable for discharge. RR even and unlabored, color WNL. Discussed discharge instructions and follow-up as directed. Discharge medications discussed if provided. Pt had opportunity to ask questions if necessary and RN to provide patient/family eduction.  
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
## 2020-05-11 NOTE — Discharge Instructions (Signed)
Please follow up with orthopedics for symptoms that are not improving over the week.  Return to the ER for symptoms that change or worsen if unable to schedule an appointment. 
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
## 2021-04-04 ENCOUNTER — Other Ambulatory Visit: Payer: Self-pay

## 2021-04-04 ENCOUNTER — Encounter: Payer: Self-pay | Admitting: Emergency Medicine

## 2021-04-04 ENCOUNTER — Emergency Department
Admission: EM | Admit: 2021-04-04 | Discharge: 2021-04-04 | Disposition: A | Discharge status: Home / Self Care | Payer: Self-pay | Attending: Emergency Medicine | Admitting: Emergency Medicine

## 2021-04-04 ENCOUNTER — Emergency Department: Payer: Self-pay

## 2021-04-04 DIAGNOSIS — W260XXA Contact with knife, initial encounter: Secondary | ICD-10-CM | POA: Insufficient documentation

## 2021-04-04 DIAGNOSIS — S61012A Laceration without foreign body of left thumb without damage to nail, initial encounter: Secondary | ICD-10-CM | POA: Insufficient documentation

## 2021-04-04 DIAGNOSIS — Z5321 Procedure and treatment not carried out due to patient leaving prior to being seen by health care provider: Secondary | ICD-10-CM | POA: Insufficient documentation

## 2021-04-04 NOTE — ED Triage Notes (Signed)
Pt called from [facility] to treatment room, no response 

## 2021-04-04 NOTE — ED Provider Notes (Signed)
Emergency Medicine Provider Triage Evaluation Note  [facility] Eddie Meadows , a 30 y.o. male  was evaluated in triage.  Pt complains of left thumb laceration.  He reports he cut it on a knife at [employer].  He rates his pain as 6 out of 10.  Denies numbness or tingling but reports his hand feels cold..  Review of Systems  Positive: Laceration of left thumb Negative: Joint pain, joint swelling, numbness or tingling  Physical Exam  Ht 5\' 7"  (1.702 m)   Wt 72.6 kg   BMI 25.06 kg/m  Gen:   Awake, appears in pain but in NAD. Resp:  Normal effort  CV:  Radial pulse 2+ on the left. MSK:   Normal flexion and extension of the left thumb. Skin:  Laceration noted at the base of the left thumb  Medical Decision Making  Medically screening exam initiated at 1:06 PM.  Appropriate orders placed.  [facility] Mead[facility] was informed that the remainder of the evaluation will be completed by another provider, this initial triage assessment does not replace that evaluation, and the importance of remaining in the ED until their evaluation is complete.     Lodema Pilot, NP 04/04/21 1307    06/04/21, MD 04/04/21 [phone]

## 2021-04-04 NOTE — ED Triage Notes (Signed)
Pt reports was in [employer] and cut his left thumb with a sharp new knife. Bleeding controlled at this time. Pt reports last tetanus was approx 3 years ago.

## 2021-04-04 NOTE — ED Notes (Signed)
Pt called x's 3 from [facility] to treatment room, no response °

## 2021-10-27 IMAGING — CR DG FINGER THUMB 2+V*L*
3 series · 3 of 3 positions shown · non-contrast
Comparison: None.

CLINICAL DATA: Thumb laceration.

EXAM:
LEFT THUMB 2+V

[finger ap]
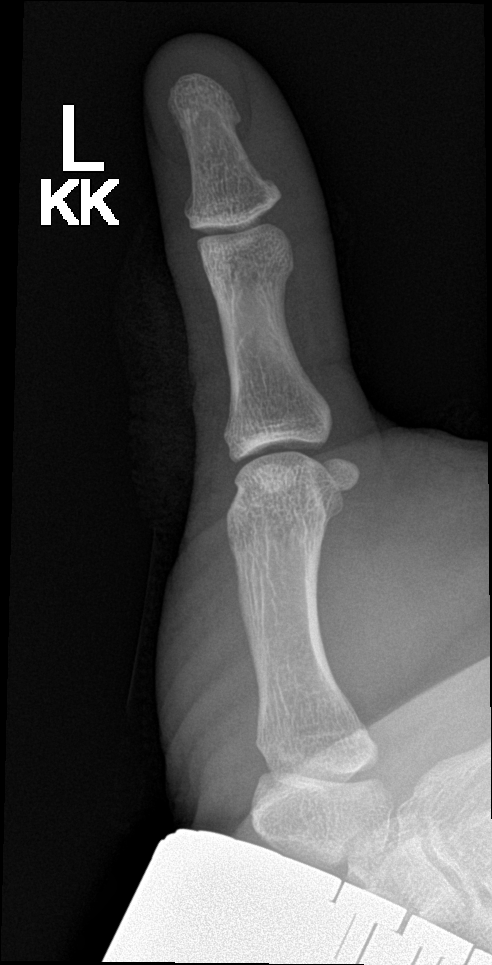

[finger obl]
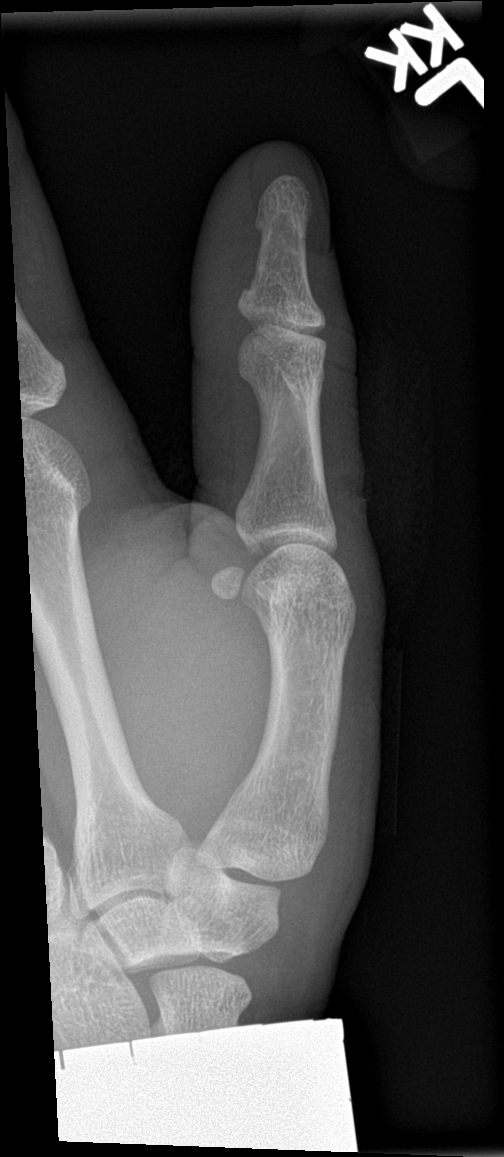

[finger lat]
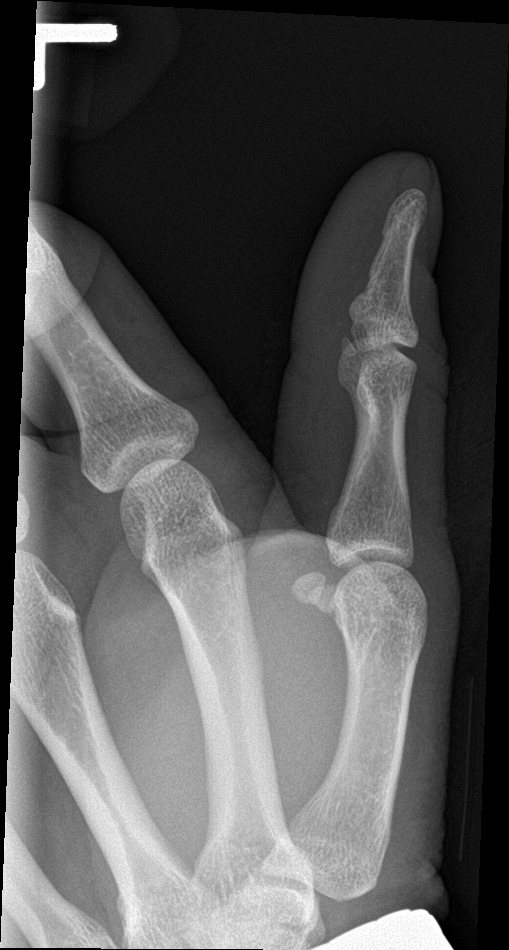

[3 of 3 positions shown; findings below may reference images not displayed]

FINDINGS: Soft tissue laceration along the dorsal radial aspect of the
proximal thumb. No radiopaque foreign body identified. No acute
fracture or dislocation. Joint spaces are preserved. Bone
mineralization is normal.
IMPRESSION: 1. Soft tissue laceration without acute osseous abnormality or
radiopaque foreign body.

## 2023-01-29 ENCOUNTER — Emergency Department: Payer: Self-pay

## 2023-01-29 ENCOUNTER — Other Ambulatory Visit: Payer: Self-pay

## 2023-01-29 ENCOUNTER — Emergency Department
Admission: EM | Admit: 2023-01-29 | Discharge: 2023-01-29 | Disposition: A | Discharge status: Home / Self Care | Payer: Self-pay | Attending: Emergency Medicine | Admitting: Emergency Medicine

## 2023-01-29 DIAGNOSIS — R0602 Shortness of breath: Secondary | ICD-10-CM | POA: Insufficient documentation

## 2023-01-29 DIAGNOSIS — R0789 Other chest pain: Secondary | ICD-10-CM | POA: Insufficient documentation

## 2023-01-29 DIAGNOSIS — M549 Dorsalgia, unspecified: Secondary | ICD-10-CM | POA: Insufficient documentation

## 2023-01-29 HISTORY — DX: Pneumothorax, unspecified: J93.9

## 2023-01-29 LAB — BASIC METABOLIC PANEL
Anion gap: 12 (ref 5–15)
BUN: 22 mg/dL — ABNORMAL HIGH (ref 6–20)
CO2: 19 mmol/L — ABNORMAL LOW (ref 22–32)
Calcium: 9.9 mg/dL (ref 8.9–10.3)
Chloride: 106 mmol/L (ref 98–111)
Creatinine, Ser: 0.97 mg/dL (ref 0.61–1.24)
GFR, Estimated: >60 mL/min (ref >60)
Glucose, Bld: 123 mg/dL — ABNORMAL HIGH (ref 70–99)
Potassium: 3.1 mmol/L — ABNORMAL LOW (ref 3.5–5.1)
Sodium: 137 mmol/L (ref 135–145)

## 2023-01-29 LAB — CBC
HCT: 47.7 % (ref 39.0–52.0)
Hemoglobin: 17.4 g/dL — ABNORMAL HIGH (ref 13.0–17.0)
MCH: 31.5 pg (ref 26.0–34.0)
MCHC: 36.5 g/dL — ABNORMAL HIGH (ref 30.0–36.0)
MCV: 86.3 fL (ref 80.0–100.0)
Platelets: 364 10*3/uL (ref 150–400)
RBC: 5.53 MIL/uL (ref 4.22–5.81)
RDW: 11.7 % (ref 11.5–15.5)
WBC: 10.5 10*3/uL (ref 4.0–10.5)
nRBC: 0 % (ref 0.0–0.2)

## 2023-01-29 LAB — TROPONIN I (HIGH SENSITIVITY)
Troponin I (High Sensitivity): 3 ng/L (ref <18)
Troponin I (High Sensitivity): 4 ng/L (ref <18)

## 2023-01-29 MED ORDER — IPRATROPIUM-ALBUTEROL 0.5-2.5 (3) MG/3ML IN SOLN
3.0000 mL | Freq: Once | RESPIRATORY_TRACT | Status: AC
  Administered 2023-01-29: 3 mL via RESPIRATORY_TRACT
  Filled 2023-01-29: qty 3

## 2023-01-29 MED ORDER — IOHEXOL 350 MG/ML SOLN
75.0000 mL | Freq: Once | INTRAVENOUS | Status: AC | PRN
  Administered 2023-01-29: 75 mL via INTRAVENOUS

## 2023-01-29 MED ORDER — ALBUTEROL SULFATE HFA 108 (90 BASE) MCG/ACT IN AERS: 2.0000 | INHALATION_SPRAY | Freq: Four times a day (QID) | RESPIRATORY_TRACT | 2 refills | Status: DC | PRN

## 2023-01-29 MED ORDER — SODIUM CHLORIDE 0.9 % IV BOLUS
1000.0000 mL | Freq: Once | INTRAVENOUS | Status: AC
  Administered 2023-01-29: 1000 mL via INTRAVENOUS

## 2023-01-29 MED ORDER — KETOROLAC TROMETHAMINE 10 MG PO TABS: 10.0000 mg | ORAL_TABLET | Freq: Three times a day (TID) | ORAL | 0 refills | Status: DC | PRN

## 2023-01-29 MED ORDER — KETOROLAC TROMETHAMINE 15 MG/ML IJ SOLN
15.0000 mg | Freq: Once | INTRAMUSCULAR | Status: AC
  Administered 2023-01-29: 15 mg via INTRAVENOUS
  Filled 2023-01-29: qty 1

## 2023-01-29 MED ORDER — LORAZEPAM 2 MG/ML IJ SOLN
1.0000 mg | Freq: Once | INTRAMUSCULAR | Status: AC
  Administered 2023-01-29: 1 mg via INTRAVENOUS
  Filled 2023-01-29: qty 1
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
## 2023-01-29 NOTE — ED Notes (Signed)
RN noted pt O2 saturation was 70% on monitor at RN station.  RN entered room and sees patient lying in bed holding breath.  RN told pt to breathe normally and pt O2 saturation increased to 100%.    MD notified.  MD noted pt O2 saturation in the 70's and entered room and spoke with patient.  Pt began talking and O2 sat increased to 100%.

## 2023-01-29 NOTE — Discharge Instructions (Signed)
Please seek medical attention for any high fevers, chest pain, shortness of breath, change in behavior, persistent vomiting, bloody stool or any other new or concerning symptoms.  

## 2023-01-29 NOTE — ED Provider Notes (Signed)
[facility] Provider Note    Event Date/Time   First MD Initiated Contact with Patient 01/29/23 1645     (approximate)   History   Chest Pain and Shortness of Breath   HPI  [facility] Eddie Meadows is a 32 y.o. male who presents to the emergency department because of concerns for chest and back pain.  Symptoms started about an hour ago.  Patient was walking when the symptoms started.  Located primarily on his left side.  Patient denies any trauma to his chest.  Pain is worse with deep breaths.  He says it reminds him of the pain he had about 8 years ago.  Per chart review he was admitted for pneumomediastinum at that time.  Patient denies any fevers or recent illness.     Physical Exam   Triage Vital Signs: ED Triage Vitals  Encounter Vitals Group     BP 01/29/23 1631 (!) 174/112     Systolic BP Percentile --      Diastolic BP Percentile --      Pulse Rate 01/29/23 1631 (!) 110     Resp 01/29/23 1631 (!) 24     Temp 01/29/23 1631 98.8 F (37.1 C)     Temp Source 01/29/23 1631 Oral     SpO2 01/29/23 1631 100 %     Weight 01/29/23 1629 174 lb (78.9 kg)     Height 01/29/23 1629 5\' 7"  (1.702 m)     Head Circumference --      Peak Flow --      Pain Score 01/29/23 1627 10     Pain Loc --      Pain Education --      Exclude from Growth Chart --     Most recent vital signs: Vitals:   01/29/23 1631  BP: (!) 174/112  Pulse: (!) 110  Resp: (!) 24  Temp: 98.8 F (37.1 C)  SpO2: 100%   General: Awake, alert CV:  Good peripheral perfusion. Tachycardia. Resp:  Normal effort. Lungs clear. Abd:  No distention.    ED Results / Procedures / Treatments   Labs (all labs ordered are listed, but only abnormal results are displayed) Labs Reviewed  CBC - Abnormal; Notable for the following components:      Result Value   Hemoglobin 17.4 (*)    MCHC 36.5 (*)    All other components within normal limits  BASIC METABOLIC PANEL  TROPONIN I (HIGH  SENSITIVITY)     EKG  I, Phineas Semen, attending physician, personally viewed and interpreted this EKG  EKG Time: 1627 Rate: 100 Rhythm: normal sinus rhythm Axis: normal Intervals: qtc 407 QRS: narrow ST changes: no st elevation Impression: normal ekg   RADIOLOGY I independently interpreted and visualized the CXR. My interpretation: No pneumothorax Radiology interpretation:  IMPRESSION:  No acute cardiopulmonary abnormality.    I independently interpreted and visualized the CT angio. My interpretation: No PE. No dissection. No pneumothorax. Radiology interpretation:  IMPRESSION:  1. Slightly suboptimal opacification of the pulmonary arterial  system. No definite acute pulmonary embolus.  2. Negative for acute airspace disease.     PROCEDURES:  Critical Care performed: No    MEDICATIONS ORDERED IN ED: Medications  LORazepam (ATIVAN) injection 1 mg (has no administration in time range)  sodium chloride 0.9 % bolus 1,000 mL (has no administration in time range)     IMPRESSION / MDM / ASSESSMENT AND PLAN / ED COURSE  I reviewed  the triage vital signs and the nursing notes.                              Differential diagnosis includes, but is not limited to, ACS, PE, pna, PTX, dissection  Patient's presentation is most consistent with acute presentation with potential threat to life or bodily function.   The patient is on the cardiac monitor to evaluate for evidence of arrhythmia and/or significant heart rate changes.  Patient presented to the emergency department today because of concerns for severe chest and back pain.  Patient has a history of pneumomediastinum.  On exam patient appears somewhat anxious.  Patient is not hypoxic although is tachycardic.  Chest x-ray without obvious pneumothorax.  Did obtain a CT to further evaluate.  Fortunately this did not show any evidence of pneumothorax, pneumonia, PE or dissection. Do wonder if patient is suffering from  pleurisy. Will continue pain control here in the emergency department.  Patient did feel better after medications here.  He became a lot more calm.  This time somewhat unclear etiology of the patient's pain.  Did discuss possible pleurisy given that he stated at one time that it was worse with deep breaths.  However given reassuring CT scan and improved clinical status I think is reasonable for patient be discharged.  Will plan on discharging with anti-inflammatories and albuterol inhaler.  Discussed return precautions.      FINAL CLINICAL IMPRESSION(S) / ED DIAGNOSES   Final diagnoses:  Atypical chest pain     Note:  This document was prepared using Dragon voice recognition software and may include unintentional dictation errors.    Phineas Semen, MD 01/29/23 [phone]

## 2023-07-01 NOTE — Progress Notes (Deleted)
 New patient visit  Patient: Eddie Meadows   DOB: 1990/11/17   32 y.o. Male  MRN: [id number] Visit Date: 07/12/2023  Today's healthcare provider: Debera Lat, PA-C   No chief complaint on file.  Subjective    [facility] Eddie Meadows is a 32 y.o. male who presents today as a new patient to establish care.  HPI  *** Discussed the use of AI scribe software for clinical note transcription with the patient, who gave verbal consent to proceed.  History of Present Illness            Past Medical History:  Diagnosis Date   Asthma    Pneumothorax    8 years ago   No past surgical history on file. Family Status  Relation Name Status   Neg Hx  (Not Specified)  No partnership data on file   Family History  Problem Relation Age of Onset   Diabetes Neg Hx    Social History   Socioeconomic History   Marital status: Single    Spouse name: Not on file   Number of children: Not on file   Years of education: Not on file   Highest education level: Not on file  Occupational History   Not on file  Tobacco Use   Smoking status: Former   Smokeless tobacco: Never  Substance and Sexual Activity   Alcohol use: Not Currently   Drug use: Not on file   Sexual activity: Not on file  Other Topics Concern   Not on file  Social History Narrative   Not on file   Social Drivers of Health   Financial Resource Strain: Not on file  Food Insecurity: Not on file  Transportation Needs: Not on file  Physical Activity: Not on file  Stress: Not on file  Social Connections: Not on file   Outpatient Medications Prior to Visit  Medication Sig   albuterol (PROVENTIL HFA;VENTOLIN HFA) 108 (90 BASE) MCG/ACT inhaler Inhale 2 puffs into the lungs every 6 (six) hours as needed for wheezing or shortness of breath.   albuterol (VENTOLIN HFA) 108 (90 Base) MCG/ACT inhaler Inhale 2 puffs into the lungs every 6 (six) hours as needed for wheezing or shortness of breath.   famotidine  (PEPCID) 20 MG tablet Take 1 tablet (20 mg total) by mouth 2 (two) times daily.   ibuprofen (ADVIL,MOTRIN) 600 MG tablet Take 1 tablet (600 mg total) by mouth every 6 (six) hours as needed for moderate pain.   ketorolac (ACULAR) 0.5 % ophthalmic solution Place 1 drop into the left eye 4 (four) times daily.   ketorolac (TORADOL) 10 MG tablet Take 1 tablet (10 mg total) by mouth every 8 (eight) hours as needed for severe pain.   methylPREDNISolone (MEDROL DOSEPAK) 4 MG TBPK tablet Take as directed on packaging   ondansetron (ZOFRAN ODT) 4 MG disintegrating tablet Take 1 tablet (4 mg total) by mouth every 8 (eight) hours as needed for nausea or vomiting.   predniSONE (DELTASONE) 10 MG tablet Label  & dispense according to the schedule below. 5 Pills PO for 1 day then, 4 Pills PO for 1 day, 3 Pills PO for 1 day, 2 Pills PO for 1 day1, 1 Pill PO for 1 days then STOP.   tiZANidine (ZANAFLEX) 4 MG tablet Take 1 tablet (4 mg total) by mouth 3 (three) times daily.   trimethoprim-polymyxin b (POLYTRIM) ophthalmic solution Place 2 drops into the left eye every 6 (six) hours.   No  facility-administered medications prior to visit.   Allergies  Allergen Reactions   Aspirin Rash   Penicillins Rash    Immunization History  Administered Date(s) Administered   Influenza,inj,Quad PF,6+ Mos 05/16/2019   Tdap 05/16/2019    Health Maintenance  Topic Date Due   HIV Screening  Never done   Hepatitis C Screening  Never done   INFLUENZA VACCINE  02/02/2023   COVID-19 Vaccine (1 - 2024-25 season) Never done   DTaP/Tdap/Td (2 - Td or Tdap) 05/15/2029   HPV VACCINES  Aged Out    Patient Care Team: Patient, No Pcp Per as PCP - General (General Practice)  Review of Systems  All other systems reviewed and are negative.  Except see HPI   {Insert previous labs (optional):23779} {See past labs  Heme  Chem  Endocrine  Serology  Results Review (optional):1}   Objective    There were no vitals taken  for this visit. {Insert last BP/Wt (optional):23777}{See vitals history (optional):1}   Physical Exam Vitals reviewed.  Constitutional:      General: He is not in acute distress.    Appearance: Normal appearance. He is not diaphoretic.  HENT:     Head: Normocephalic and atraumatic.  Eyes:     General: No scleral icterus.    Conjunctiva/sclera: Conjunctivae normal.  Cardiovascular:     Rate and Rhythm: Normal rate and regular rhythm.     Pulses: Normal pulses.     Heart sounds: Normal heart sounds. No murmur heard. Pulmonary:     Effort: Pulmonary effort is normal. No respiratory distress.     Breath sounds: Normal breath sounds. No wheezing or rhonchi.  Musculoskeletal:     Cervical back: Neck supple.     Right lower leg: No edema.     Left lower leg: No edema.  Lymphadenopathy:     Cervical: No cervical adenopathy.  Skin:    General: Skin is warm and dry.     Findings: No rash.  Neurological:     Mental Status: He is alert and oriented to person, place, and time. Mental status is at baseline.  Psychiatric:        Mood and Affect: Mood normal.        Behavior: Behavior normal.     Depression Screen     No data to display         No results found for any visits on 07/12/23.  Assessment & Plan     *** Assessment and Plan              Encounter to establish care Welcomed to our clinic Reviewed past medical hx, social hx, family hx and surgical hx Pt advised to send all vaccination records or screening   No follow-ups on file.    The patient was advised to call back or seek an in-person evaluation if the symptoms worsen or if the condition fails to improve as anticipated.  I discussed the assessment and treatment plan with the patient. The patient was provided an opportunity to ask questions and all were answered. The patient agreed with the plan and demonstrated an understanding of the instructions.  I, Debera Lat, PA-C have reviewed all  documentation for this visit. The documentation on  07/12/2023   for the exam, diagnosis, procedures, and orders are all accurate and complete.  Debera Lat, [facility], [facility] [phone] (phone) [phone] (fax)  [facility]

## 2023-07-12 ENCOUNTER — Ambulatory Visit: Payer: Self-pay | Admitting: Physician Assistant

## 2023-07-12 DIAGNOSIS — Z7689 Persons encountering health services in other specified circumstances: Secondary | ICD-10-CM

## 2023-10-05 ENCOUNTER — Other Ambulatory Visit: Payer: Self-pay

## 2023-10-05 ENCOUNTER — Emergency Department: Payer: Self-pay

## 2023-10-05 ENCOUNTER — Emergency Department
Admission: EM | Admit: 2023-10-05 | Discharge: 2023-10-05 | Disposition: A | Discharge status: Home / Self Care | Payer: Self-pay | Attending: Emergency Medicine | Admitting: Emergency Medicine

## 2023-10-05 DIAGNOSIS — J45909 Unspecified asthma, uncomplicated: Secondary | ICD-10-CM | POA: Insufficient documentation

## 2023-10-05 DIAGNOSIS — J069 Acute upper respiratory infection, unspecified: Secondary | ICD-10-CM | POA: Insufficient documentation

## 2023-10-05 DIAGNOSIS — Z87891 Personal history of nicotine dependence: Secondary | ICD-10-CM | POA: Insufficient documentation

## 2023-10-05 LAB — RESP PANEL BY RT-PCR (RSV, FLU A&B, COVID)  RVPGX2
Influenza A by PCR: NEGATIVE
Influenza B by PCR: NEGATIVE
Resp Syncytial Virus by PCR: NEGATIVE
SARS Coronavirus 2 by RT PCR: NEGATIVE

## 2023-10-05 LAB — GROUP A STREP BY PCR: Group A Strep by PCR: NOT DETECTED

## 2023-10-05 MED ORDER — PSEUDOEPH-BROMPHEN-DM 30-2-10 MG/5ML PO SYRP: 5.0000 mL | ORAL_SOLUTION | Freq: Four times a day (QID) | ORAL | 0 refills | Status: AC | PRN

## 2023-10-05 NOTE — Discharge Instructions (Addendum)
 Follow-up with your primary care provider if any continued problems or concerns.  You may also follow-up with [place] urgent care as needed.  Increase fluids to stay hydrated.  A prescription for Bromfed-DM was sent to the pharmacy for you to take as needed for cough and congestion.  Tylenol as needed for fever or bodyaches.  May also take ibuprofen as needed for body aches.

## 2023-10-05 NOTE — ED Provider Notes (Signed)
 [facility] Provider Note    Event Date/Time   First MD Initiated Contact with Patient 10/05/23 0710     (approximate)   History   Sore Throat   HPI  [facility] Eddie Meadows is a 33 y.o. male presents to the ED with complaint of cough, fever, congestion and sore throat for the last 6 days.  Patient states that he has been coughing a lot causing his upper back to hurt.  Patient has a history of a spontaneous pneumothorax approximately 8 years ago and is anxious that he has "collapsed lung".  Patient is a former smoker.  He has a history of asthma.     Physical Exam   Triage Vital Signs: ED Triage Vitals  Encounter Vitals Group     BP 10/05/23 0626 (!) 143/84     Systolic BP Percentile --      Diastolic BP Percentile --      Pulse Rate 10/05/23 0626 82     Resp 10/05/23 0626 18     Temp 10/05/23 0626 98.1 F (36.7 C)     Temp Source 10/05/23 0626 Oral     SpO2 10/05/23 0626 96 %     Weight 10/05/23 0626 165 lb (74.8 kg)     Height 10/05/23 0626 5\' 7"  (1.702 m)     Head Circumference --      Peak Flow --      Pain Score 10/05/23 0625 8     Pain Loc --      Pain Education --      Exclude from Growth Chart --     Most recent vital signs: Vitals:   10/05/23 0626  BP: (!) 143/84  Pulse: 82  Resp: 18  Temp: 98.1 F (36.7 C)  SpO2: 96%     General: Awake, no distress.  Talkative, no obvious shortness of breath. CV:  Good peripheral perfusion.  Heart regular rate rhythm. Resp:  Normal effort.  Lungs are clear bilaterally. Abd:  No distention.  Other:     ED Results / Procedures / Treatments   Labs (all labs ordered are listed, but only abnormal results are displayed) Labs Reviewed  RESP PANEL BY RT-PCR (RSV, FLU A&B, COVID)  RVPGX2  GROUP A STREP BY PCR      RADIOLOGY Chest x-ray images were reviewed and interpreted by myself independent of the radiologist with no new cardiopulmonary findings.    PROCEDURES:  Critical  Care performed:   Procedures   MEDICATIONS ORDERED IN ED: Medications - No data to display   IMPRESSION / MDM / ASSESSMENT AND PLAN / ED COURSE  I reviewed the triage vital signs and the nursing notes.   Differential diagnosis includes, but is not limited to, COVID, influenza, RSV, strep pharyngitis, pneumonia, bronchitis, viral illness, seasonal allergies, unlikely but spontaneous recurrent pneumothorax was considered.  33 year old male presents to the ED with complaint of 6 days cough and congestion along with some fever.  Patient is anxious as he has had a pneumothorax 8 years ago and has had some discomfort with coughing.  Patient is here with wife with similar symptoms.  He was made aware that his respiratory panel and strep were negative.  Chest x-ray was reassuring and patient was made aware that there is no pneumothorax or pneumonia.  A prescription for Bromfed was sent to the pharmacy for him to take as needed for cough and congestion.  He is encouraged to drink fluids to stay hydrated  and Tylenol and ibuprofen as needed for body aches.  He is to follow-up with his PCP or urgent care if any continued problems.      Patient's presentation is most consistent with acute complicated illness / injury requiring diagnostic workup.  FINAL CLINICAL IMPRESSION(S) / ED DIAGNOSES   Final diagnoses:  Viral URI with cough     Rx / DC Orders   ED Discharge Orders          Ordered    brompheniramine-pseudoephedrine-DM 30-2-10 MG/5ML syrup  4 times daily PRN        10/05/23 0802             Note:  This document was prepared using Dragon voice recognition software and may include unintentional dictation errors.   Tommi Rumps, PA-C 10/05/23 2956    Sharyn Creamer, MD 10/06/23 [phone]

## 2023-10-05 NOTE — ED Notes (Signed)
 See triage note  Presents with sore throat and body aches for about 5 days  Also having some chest discomfort with breathing and pain to both posterior shoulder Afebrile on arrival

## 2023-10-05 NOTE — ED Triage Notes (Signed)
 Pt reports sore throat x4 days, pt states when he coughs his upper back hurts.

## 2024-07-16 ENCOUNTER — Emergency Department
Admission: EM | Admit: 2024-07-16 | Discharge: 2024-07-16 | Disposition: A | Discharge status: Home / Self Care | Payer: Self-pay | Attending: Emergency Medicine | Admitting: Emergency Medicine

## 2024-07-16 DIAGNOSIS — J45909 Unspecified asthma, uncomplicated: Secondary | ICD-10-CM | POA: Insufficient documentation

## 2024-07-16 DIAGNOSIS — J069 Acute upper respiratory infection, unspecified: Secondary | ICD-10-CM | POA: Insufficient documentation

## 2024-07-16 LAB — RESP PANEL BY RT-PCR (RSV, FLU A&B, COVID)  RVPGX2
Influenza A by PCR: NEGATIVE
Influenza B by PCR: NEGATIVE
Resp Syncytial Virus by PCR: NEGATIVE
SARS Coronavirus 2 by RT PCR: NEGATIVE

## 2024-07-16 MED ORDER — ALBUTEROL SULFATE HFA 108 (90 BASE) MCG/ACT IN AERS: 2.0000 | INHALATION_SPRAY | Freq: Four times a day (QID) | RESPIRATORY_TRACT | 2 refills | Status: AC | PRN

## 2024-07-16 NOTE — Discharge Instructions (Addendum)
 Your symptoms appear to be viral in nature.  Please follow-up with your outpatient provider.  Please return for any new, worsening, or changing symptoms or other concerns.

## 2024-07-16 NOTE — ED Provider Notes (Signed)
 "  [facility] Provider Note    Event Date/Time   First MD Initiated Contact with Patient 07/16/24 1336     (approximate)   History   Fever   HPI  [facility] Kristy is a 34 y.o. male who presents today for evaluation of fever, cough, body aches, nasal congestion for the past 2 days.  Patient reports that his fiance and his son are sick with the same symptoms.  He denies abdominal pain.  No chest pain or shortness of breath.  He reports that he had asthma as a child and is worried that he might have difficulty breathing.  He has not had any difficulty breathing.  Patient Active Problem List   Diagnosis Date Noted   Pneumomediastinum (HCC) 01/07/2015          Physical Exam   Triage Vital Signs: ED Triage Vitals  Encounter Vitals Group     BP 07/16/24 1246 (!) 121/100     Girls Systolic BP Percentile --      Girls Diastolic BP Percentile --      Boys Systolic BP Percentile --      Boys Diastolic BP Percentile --      Pulse Rate 07/16/24 1246 61     Resp 07/16/24 1246 18     Temp 07/16/24 1246 98.5 F (36.9 C)     Temp Source 07/16/24 1246 Oral     SpO2 07/16/24 1246 99 %     Weight 07/16/24 1247 159 lb (72.1 kg)     Height 07/16/24 1247 5' 7 (1.702 m)     Head Circumference --      Peak Flow --      Pain Score 07/16/24 1247 9     Pain Loc --      Pain Education --      Exclude from Growth Chart --     Most recent vital signs: Vitals:   07/16/24 1246  BP: (!) 121/100  Pulse: 61  Resp: 18  Temp: 98.5 F (36.9 C)  SpO2: 99%    Physical Exam Vitals and nursing note reviewed.  Constitutional:      General: Awake and alert. No acute distress.    Appearance: Normal appearance. The patient is normal weight.  HENT:     Head: Normocephalic and atraumatic.     Mouth: Mucous membranes are moist.   Nasal congestion present Eyes:     General: PERRL. Normal EOMs        Right eye: No discharge.        Left eye: No discharge.      Conjunctiva/sclera: Conjunctivae normal.  Cardiovascular:     Rate and Rhythm: Normal rate and regular rhythm.     Pulses: Normal pulses.  Pulmonary:     Effort: Pulmonary effort is normal. No respiratory distress.     Breath sounds: Normal breath sounds.  Able to speak easily in complete sentences. Abdominal:     Abdomen is soft. There is no abdominal tenderness. No rebound or guarding. No distention. Musculoskeletal:        General: No swelling. Normal range of motion.     Cervical back: Normal range of motion and neck supple.  Skin:    General: Skin is warm and dry.     Capillary Refill: Capillary refill takes less than 2 seconds.     Findings: No rash.  Neurological:     Mental Status: The patient is awake and alert.  ED Results / Procedures / Treatments   Labs (all labs ordered are listed, but only abnormal results are displayed) Labs Reviewed  RESP PANEL BY RT-PCR (RSV, FLU A&B, COVID)  RVPGX2     EKG     RADIOLOGY     PROCEDURES:  Critical Care performed:   Procedures   MEDICATIONS ORDERED IN ED: Medications - No data to display   IMPRESSION / MDM / ASSESSMENT AND PLAN / ED COURSE  I reviewed the triage vital signs and the nursing notes.   Differential diagnosis includes, but is not limited to, COVID, influenza, RSV, other URI.  Patient is awake and alert, hemodynamically stable and afebrile.  He is nontoxic in appearance.  He has a normal oxygen saturation of 99% on room air and demonstrates no increased work of breathing.  His lungs are clear to auscultation bilaterally.  I do not suspect pneumonia.  Symptoms of cough, body aches, nasal congestion are consistent with viral URI.  COVID/flu/RSV swab obtained in triage is negative.  Given his concern about his history of asthma, he was given a refill of his albuterol  inhaler.  We discussed symptomatic management and return precautions.  Patient understands and agrees with plan.  He was discharged  in stable condition.   Patient's presentation is most consistent with acute complicated illness / injury requiring diagnostic workup.     FINAL CLINICAL IMPRESSION(S) / ED DIAGNOSES   Final diagnoses:  Upper respiratory tract infection, unspecified type  Mild asthma without complication, unspecified whether persistent     Rx / DC Orders   ED Discharge Orders          Ordered    albuterol  (VENTOLIN  HFA) 108 (90 Base) MCG/ACT inhaler  Every 6 hours PRN        07/16/24 1424             Note:  This document was prepared using Dragon voice recognition software and may include unintentional dictation errors.   [place] Kirchner E, PA-C 07/16/24 1500    Bradler, Evan K, MD 07/16/24 1530  "

## 2024-07-16 NOTE — ED Triage Notes (Signed)
 Pt presents to the ED via POV from home with fever, headaches, chills, and cough x4 days. Pt A&Ox4. Pt reports 2 episodes of emesis last night.
